# Patient Record
Sex: Female | Born: 1985 | Race: White | Hispanic: No | Marital: Single | Smoking: Never smoker
Health system: Southern US, Community
[De-identification: ages and names within clinical notes are randomized; demographics above are authoritative.]

## PROBLEM LIST (undated history)

## (undated) DIAGNOSIS — R51 Headache: Secondary | ICD-10-CM

## (undated) DIAGNOSIS — K219 Gastro-esophageal reflux disease without esophagitis: Secondary | ICD-10-CM

## (undated) DIAGNOSIS — R030 Elevated blood-pressure reading, without diagnosis of hypertension: Secondary | ICD-10-CM

## (undated) DIAGNOSIS — R519 Headache, unspecified: Secondary | ICD-10-CM

## (undated) DIAGNOSIS — I10 Essential (primary) hypertension: Secondary | ICD-10-CM

## (undated) DIAGNOSIS — J45909 Unspecified asthma, uncomplicated: Secondary | ICD-10-CM

## (undated) DIAGNOSIS — R42 Dizziness and giddiness: Secondary | ICD-10-CM

## (undated) DIAGNOSIS — C541 Malignant neoplasm of endometrium: Secondary | ICD-10-CM

## (undated) DIAGNOSIS — R1011 Right upper quadrant pain: Secondary | ICD-10-CM

## (undated) HISTORY — PX: NO PAST SURGERIES: SHX2092

## (undated) HISTORY — DX: Gastro-esophageal reflux disease without esophagitis: K21.9

## (undated) HISTORY — DX: Unspecified asthma, uncomplicated: J45.909

## (undated) HISTORY — DX: Essential (primary) hypertension: I10

## (undated) HISTORY — DX: Dizziness and giddiness: R42

## (undated) HISTORY — DX: Malignant neoplasm of endometrium: C54.1

## (undated) HISTORY — DX: Right upper quadrant pain: R10.11

## (undated) HISTORY — PX: ABDOMINAL HYSTERECTOMY: SHX81

## (undated) HISTORY — DX: Headache: R51

## (undated) HISTORY — DX: Headache, unspecified: R51.9

## (undated) HISTORY — DX: Elevated blood-pressure reading, without diagnosis of hypertension: R03.0

---

## 2016-08-30 ENCOUNTER — Ambulatory Visit
Admission: RE | Admit: 2016-08-30 | Discharge: 2016-08-30 | Disposition: A | Discharge status: Home / Self Care | Payer: 59 | Source: Ambulatory Visit | Attending: Family | Admitting: Family

## 2016-08-30 ENCOUNTER — Ambulatory Visit (INDEPENDENT_AMBULATORY_CARE_PROVIDER_SITE_OTHER): Payer: 59 | Admitting: Family

## 2016-08-30 ENCOUNTER — Encounter: Payer: Self-pay | Admitting: Family

## 2016-08-30 ENCOUNTER — Telehealth: Payer: Self-pay | Admitting: Family

## 2016-08-30 VITALS — BP 166/90 | HR 99 | Temp 98.4°F | Ht 67.5 in | Wt 310.2 lb

## 2016-08-30 DIAGNOSIS — R1011 Right upper quadrant pain: Secondary | ICD-10-CM | POA: Insufficient documentation

## 2016-08-30 DIAGNOSIS — Z7689 Persons encountering health services in other specified circumstances: Secondary | ICD-10-CM | POA: Diagnosis not present

## 2016-08-30 DIAGNOSIS — R030 Elevated blood-pressure reading, without diagnosis of hypertension: Secondary | ICD-10-CM

## 2016-08-30 DIAGNOSIS — I10 Essential (primary) hypertension: Secondary | ICD-10-CM | POA: Insufficient documentation

## 2016-08-30 DIAGNOSIS — Z0000 Encounter for general adult medical examination without abnormal findings: Secondary | ICD-10-CM | POA: Insufficient documentation

## 2016-08-30 HISTORY — DX: Right upper quadrant pain: R10.11

## 2016-08-30 HISTORY — DX: Elevated blood-pressure reading, without diagnosis of hypertension: R03.0

## 2016-08-30 LAB — POCT URINE PREGNANCY: PREG TEST UR: NEGATIVE

## 2016-08-30 MED ORDER — LISINOPRIL 5 MG PO TABS: 5.0000 mg | ORAL_TABLET | Freq: Every day | ORAL | 1 refills | Status: DC

## 2016-08-30 NOTE — Patient Instructions (Signed)
Trial of lisinopril for blood pressure. Goal blood pressures less than 140/90. Please monitor and let me know if headaches do not resolve.   Right upper quadrant ultrasound ordered for today.  Labs today. Follow-up in one week to recheck blood pressure and lab work.   Follow-up in one month for physical.

## 2016-08-30 NOTE — Telephone Encounter (Signed)
Discussed results abdominal ultrasound which not show any acute cholelithiasis. we jointly agreed she would try Prilosec for the next couple days and follow up next week to decide if we did a hepatobiliary NM scan.

## 2016-08-30 NOTE — Progress Notes (Signed)
Subjective:    Patient ID: Laurie Cameron, female    DOB: 11/19/1985, 30 y.o.   MRN: [id number]  CC: Laurie OkSarah Cameron is a 30 y.o. female who presents today to establish care.    HPI: No prior PCP. New to practice.  Complains of RUQ pain  which started 5 days ago, would 'bounce around' from left side side of chest to right side. Triggered my meals. Last 1/2 hour and then resolves on it's own.  Denies exertional chest pain or pressure, numbness or tingling radiating to left arm or jaw, palpitations, dizziness, frequent headaches, changes in vision, or shortness of breath. Hasn't tried any medications.   Also concerned about blood pressure has been going on for several months, unchanged. At dentist couple months ago 145/97. HA"s Almost everyday. Pain not in one place, sometimes back of neck, others front of head. Ibuprofen works. Mostly notices HA at work. Not waking her up, positional.   No arrhythmia, SCD in family    HISTORY:  Past Medical History:  Diagnosis Date  . Asthma   . Dizziness   . Frequent headaches   . GERD (gastroesophageal reflux disease)   . Hypertension    History reviewed. No pertinent surgical history. Family History  Problem Relation Age of Onset  . Arthritis Mother   . Hyperlipidemia Mother   . Hypertension Mother   . Arthritis Father   . Hyperlipidemia Father   . Heart disease Father   . Hypertension Father   . GER disease Father   . Alcohol abuse Paternal Uncle   . Cancer Paternal Uncle   . Arthritis Maternal Grandmother   . Cancer Maternal Grandmother   . Hyperlipidemia Maternal Grandmother   . Heart disease Maternal Grandmother   . Hypertension Maternal Grandmother   . Diabetes Maternal Grandmother   . Arthritis Maternal Grandfather   . Hyperlipidemia Maternal Grandfather   . Alcohol abuse Paternal Grandmother   . Arthritis Paternal Grandmother   . Hyperlipidemia Paternal Grandmother   . Hypertension Paternal Grandmother   . Mental illness  Paternal Grandmother   . Alcohol abuse Paternal Grandfather   . Arthritis Paternal Grandfather   . Hyperlipidemia Paternal Grandfather   . Cancer Maternal Aunt   . Hyperlipidemia Maternal Aunt   . Hypertension Maternal Aunt   . Diabetes Maternal Aunt   . Cancer Maternal Uncle   . Mental illness Paternal Aunt     Allergies: Patient has no allergy information on record. No current outpatient prescriptions on file prior to visit.   No current facility-administered medications on file prior to visit.     Social History  Substance Use Topics  . Smoking status: Never Smoker  . Smokeless tobacco: Never Used  . Alcohol use Yes    Review of Systems  Constitutional: Negative for chills, fever and unexpected weight change.  HENT: Negative for congestion.   Respiratory: Negative for cough.   Cardiovascular: Negative for chest pain, palpitations and leg swelling.  Gastrointestinal: Positive for abdominal pain. Negative for abdominal distention, diarrhea, nausea and vomiting.  Musculoskeletal: Negative for arthralgias and myalgias.  Skin: Negative for rash.  Neurological: Positive for headaches.  Hematological: Negative for adenopathy.  Psychiatric/Behavioral: Negative for confusion.      Objective:    BP (!) 166/90   Pulse 99   Temp 98.4 F (36.9 C) (Oral)   Ht 5' 7.5" (1.715 m)   Wt (!) 310 lb 3.2 oz (140.7 kg)   LMP 08/16/2016 (Exact Date)   SpO2 99%  BMI 47.87 kg/m  BP Readings from Last 3 Encounters:  08/30/16 (!) 166/90   Wt Readings from Last 3 Encounters:  08/30/16 (!) 310 lb 3.2 oz (140.7 kg)    Physical Exam  Constitutional: She appears well-developed and well-nourished.  HENT:  Mouth/Throat: Uvula is midline, oropharynx is clear and moist and mucous membranes are normal.  Eyes: Conjunctivae and EOM are normal. Pupils are equal, round, and reactive to light.  Fundus normal bilaterally.   Cardiovascular: Normal rate, regular rhythm, normal heart sounds and  normal pulses.   Pulmonary/Chest: Effort normal and breath sounds normal. She has no wheezes. She has no rhonchi. She has no rales.  Abdominal: Soft. Normal appearance and bowel sounds are normal. She exhibits no distension, no fluid wave, no ascites and no mass. There is no tenderness. There is no rigidity, no rebound, no guarding and no CVA tenderness.  Neurological: She is alert. She has normal strength. No cranial nerve deficit or sensory deficit. She displays a negative Romberg sign.  Reflex Scores:      Bicep reflexes are 2+ on the right side and 2+ on the left side.      Patellar reflexes are 2+ on the right side and 2+ on the left side. Grip equal and strong bilateral upper extremities. Gait strong and steady. Able to perform rapid alternating movement without difficulty.   Skin: Skin is warm and dry.  Psychiatric: She has a normal mood and affect. Her speech is normal and behavior is normal. Thought content normal.  Vitals reviewed.      Assessment & Plan:   Problem List Items Addressed This Visit      Other   Elevated blood pressure reading    Reassured by normal neurologic exam. In the context of patient's headache, family history, and elevated blood pressure reading today, we jointly agreed to go ahead and start blood pressure medication. Follow-up in one week.      Relevant Medications   lisinopril (PRINIVIL,ZESTRIL) 5 MG tablet   RUQ pain    Symptoms consistent with possible cholelithiasis. Pending ultrasound, labs.      Relevant Orders   US Abdomen Limited RUQ   Encounter to establish care - Primary    Reviewed past medical social family history the patient. Patient will return for CPE. Went ahead and ordered screening CPE labs today. Note Has never had pap.      Relevant Orders   US Abdomen Limited RUQ   CBC with Differential/Platelet   Comprehensive metabolic panel   Hemoglobin A1c   HIV antibody   Lipid panel   TSH   VITAMIN D 25 Hydroxy (Vit-D Deficiency,  Fractures)   POCT urine pregnancy       I am having Ms. Kinner start on lisinopril.   Meds ordered this encounter  Medications  . lisinopril (PRINIVIL,ZESTRIL) 5 MG tablet    Sig: Take 1 tablet (5 mg total) by mouth daily.    Dispense:  90 tablet    Refill:  1    Order Specific Question:   Supervising Provider    Answer:   Sherlene ShamsULLO, TERESA L [[id number]]    Return precautions given.   Risks, benefits, and alternatives of the medications and treatment plan prescribed today were discussed, and patient expressed understanding.   Education regarding symptom management and diagnosis given to patient on AVS.  Continue to follow with Rennie PlowmanMargaret Zabdi Mis, FNP for routine health maintenance.   Laurie OkSarah Ramaswamy and I agreed with plan.   Claris CheMargaret  Vidal Schwalbe, FNP

## 2016-08-30 NOTE — Assessment & Plan Note (Signed)
Reassured by normal neurologic exam. In the context of patient's headache, family history, and elevated blood pressure reading today, we jointly agreed to go ahead and start blood pressure medication. Follow-up in one week.

## 2016-08-30 NOTE — Assessment & Plan Note (Signed)
Reviewed past medical social family history the patient. Patient will return for CPE. Went ahead and ordered screening CPE labs today. Note Has never had pap.

## 2016-08-30 NOTE — Assessment & Plan Note (Signed)
Symptoms consistent with possible cholelithiasis. Pending ultrasound, labs.

## 2016-08-30 NOTE — Progress Notes (Signed)
Pre visit review using our clinic review tool, if applicable. No additional management support is needed unless otherwise documented below in the visit note. 

## 2016-09-02 LAB — CBC WITH DIFFERENTIAL/PLATELET
BASOS ABS: 0 10*3/uL (ref 0.0–0.2)
BASOS: 0 %
EOS (ABSOLUTE): 0.2 10*3/uL (ref 0.0–0.4)
Eos: 2 %
Hematocrit: 39.6 % (ref 34.0–46.6)
Hemoglobin: 13.4 g/dL (ref 11.1–15.9)
IMMATURE GRANS (ABS): 0 10*3/uL (ref 0.0–0.1)
IMMATURE GRANULOCYTES: 0 %
LYMPHS: 27 %
Lymphocytes Absolute: 2.5 10*3/uL (ref 0.7–3.1)
MCH: 28.2 pg (ref 26.6–33.0)
MCHC: 33.8 g/dL (ref 31.5–35.7)
MCV: 83 fL (ref 79–97)
MONOS ABS: 0.4 10*3/uL (ref 0.1–0.9)
Monocytes: 5 %
NEUTROS ABS: 6.1 10*3/uL (ref 1.4–7.0)
NEUTROS PCT: 66 %
PLATELETS: 352 10*3/uL (ref 150–379)
RBC: 4.75 x10E6/uL (ref 3.77–5.28)
RDW: 13.6 % (ref 12.3–15.4)
WBC: 9.3 10*3/uL (ref 3.4–10.8)

## 2016-09-02 LAB — COMPREHENSIVE METABOLIC PANEL
ALBUMIN: 4.5 g/dL (ref 3.5–5.5)
ALK PHOS: 50 IU/L (ref 39–117)
ALT: 18 IU/L (ref 0–32)
AST: 24 IU/L (ref 0–40)
Albumin/Globulin Ratio: 1.7 (ref 1.2–2.2)
BILIRUBIN TOTAL: 0.6 mg/dL (ref 0.0–1.2)
BUN / CREAT RATIO: 18 (ref 9–23)
BUN: 13 mg/dL (ref 6–20)
CHLORIDE: 100 mmol/L (ref 96–106)
CO2: 22 mmol/L (ref 18–29)
Calcium: 9.3 mg/dL (ref 8.7–10.2)
Creatinine, Ser: 0.73 mg/dL (ref 0.57–1.00)
GFR calc Af Amer: 127 mL/min/{1.73_m2} (ref >59)
GFR calc non Af Amer: 110 mL/min/{1.73_m2} (ref >59)
GLOBULIN, TOTAL: 2.7 g/dL (ref 1.5–4.5)
Glucose: 101 mg/dL — ABNORMAL HIGH (ref 65–99)
POTASSIUM: 5.2 mmol/L (ref 3.5–5.2)
SODIUM: 139 mmol/L (ref 134–144)
Total Protein: 7.2 g/dL (ref 6.0–8.5)

## 2016-09-02 LAB — LIPID PANEL
CHOLESTEROL TOTAL: 211 mg/dL — AB (ref 100–199)
Chol/HDL Ratio: 4.1 ratio units (ref 0.0–4.4)
HDL: 51 mg/dL (ref >39)
LDL Calculated: 136 mg/dL — ABNORMAL HIGH (ref 0–99)
Triglycerides: 118 mg/dL (ref 0–149)
VLDL Cholesterol Cal: 24 mg/dL (ref 5–40)

## 2016-09-02 LAB — TSH: TSH: 4.18 u[IU]/mL (ref 0.450–4.500)

## 2016-09-02 LAB — VITAMIN D 25 HYDROXY (VIT D DEFICIENCY, FRACTURES): Vit D, 25-Hydroxy: 24.6 ng/mL — ABNORMAL LOW (ref 30.0–100.0)

## 2016-09-02 LAB — HIV ANTIBODY (ROUTINE TESTING W REFLEX): HIV SCREEN 4TH GENERATION: NONREACTIVE

## 2016-09-02 LAB — HEMOGLOBIN A1C
Est. average glucose Bld gHb Est-mCnc: 117 mg/dL
HEMOGLOBIN A1C: 5.7 % — AB (ref 4.8–5.6)

## 2016-09-06 ENCOUNTER — Ambulatory Visit (INDEPENDENT_AMBULATORY_CARE_PROVIDER_SITE_OTHER): Payer: 59 | Admitting: Family

## 2016-09-06 ENCOUNTER — Encounter: Payer: Self-pay | Admitting: Family

## 2016-09-06 VITALS — BP 140/78 | HR 86 | Wt 308.0 lb

## 2016-09-06 DIAGNOSIS — R030 Elevated blood-pressure reading, without diagnosis of hypertension: Secondary | ICD-10-CM

## 2016-09-06 NOTE — Progress Notes (Signed)
Subjective:    Patient ID: Laurie Cameron, female    DOB: March 19, 1986, 30 y.o.   MRN: [id number]  CC: Laurie Cameron is a 30 y.o. female who presents today for follow up.   HPI: One-week follow-up after starting lisinopril for elevated blood pressure reading. HA's resolved. Denies exertional chest pain or pressure, numbness or tingling radiating to left arm or jaw, palpitations, dizziness, frequent headaches, changes in vision, or shortness of breath.   No RUQ pain.     HISTORY:  Past Medical History:  Diagnosis Date  . Asthma   . Dizziness   . Frequent headaches   . GERD (gastroesophageal reflux disease)   . Hypertension    No past surgical history on file. Family History  Problem Relation Age of Onset  . Arthritis Mother   . Hyperlipidemia Mother   . Hypertension Mother   . Arthritis Father   . Hyperlipidemia Father   . Heart disease Father   . Hypertension Father   . GER disease Father   . Alcohol abuse Paternal Uncle   . Cancer Paternal Uncle   . Arthritis Maternal Grandmother   . Cancer Maternal Grandmother   . Hyperlipidemia Maternal Grandmother   . Heart disease Maternal Grandmother   . Hypertension Maternal Grandmother   . Diabetes Maternal Grandmother   . Arthritis Maternal Grandfather   . Hyperlipidemia Maternal Grandfather   . Alcohol abuse Paternal Grandmother   . Arthritis Paternal Grandmother   . Hyperlipidemia Paternal Grandmother   . Hypertension Paternal Grandmother   . Mental illness Paternal Grandmother   . Alcohol abuse Paternal Grandfather   . Arthritis Paternal Grandfather   . Hyperlipidemia Paternal Grandfather   . Cancer Maternal Aunt   . Hyperlipidemia Maternal Aunt   . Hypertension Maternal Aunt   . Diabetes Maternal Aunt   . Cancer Maternal Uncle   . Mental illness Paternal Aunt     Allergies: Patient has no allergy information on record. Current Outpatient Prescriptions on File Prior to Visit  Medication Sig Dispense Refill  .  lisinopril (PRINIVIL,ZESTRIL) 5 MG tablet Take 1 tablet (5 mg total) by mouth daily. 90 tablet 1   No current facility-administered medications on file prior to visit.     Social History  Substance Use Topics  . Smoking status: Never Smoker  . Smokeless tobacco: Never Used  . Alcohol use Yes    Review of Systems  Constitutional: Negative for chills and fever.  Eyes: Negative for visual disturbance.  Respiratory: Negative for cough.   Cardiovascular: Negative for chest pain and palpitations.  Gastrointestinal: Negative for nausea and vomiting.  Neurological: Negative for headaches.      Objective:    BP 140/78   Pulse 86   Wt (!) 308 lb (139.7 kg)   LMP 08/16/2016 (Exact Date)   SpO2 98%   BMI 47.53 kg/m  BP Readings from Last 3 Encounters:  09/06/16 140/78  08/30/16 (!) 166/90   Wt Readings from Last 3 Encounters:  09/06/16 (!) 308 lb (139.7 kg)  08/30/16 (!) 310 lb 3.2 oz (140.7 kg)    Physical Exam  Constitutional: She appears well-developed and well-nourished.  Eyes: Conjunctivae are normal.  Cardiovascular: Normal rate, regular rhythm, normal heart sounds and normal pulses.   Pulmonary/Chest: Effort normal and breath sounds normal. She has no wheezes. She has no rhonchi. She has no rales.  Neurological: She is alert.  Skin: Skin is warm and dry.  Psychiatric: She has a normal mood and affect.  Her speech is normal and behavior is normal. Thought content normal.  Vitals reviewed.      Assessment & Plan:   Problem List Items Addressed This Visit      Other   Elevated blood pressure reading - Primary    At goal. Pending CMP.       Relevant Orders   Comprehensive metabolic panel       I am having Ms. Grimmer maintain her lisinopril.   No orders of the defined types were placed in this encounter.   Return precautions given.   Risks, benefits, and alternatives of the medications and treatment plan prescribed today were discussed, and patient  expressed understanding.   Education regarding symptom management and diagnosis given to patient on AVS.  Continue to follow with Rennie PlowmanMargaret Dailey Alberson, FNP for routine health maintenance.   Laurie Cameron and I agreed with plan.   Rennie PlowmanMargaret Harden Bramer, FNP

## 2016-09-06 NOTE — Patient Instructions (Signed)
Check labs today  Let me know if abdominal pain recurs and I will order hepatobiliary scan  Blood pressure looks great.

## 2016-09-06 NOTE — Assessment & Plan Note (Signed)
At goal. Pending CMP. 

## 2016-09-07 LAB — COMPREHENSIVE METABOLIC PANEL
ALT: 19 IU/L (ref 0–32)
AST: 22 IU/L (ref 0–40)
Albumin/Globulin Ratio: 1.6 (ref 1.2–2.2)
Albumin: 4.7 g/dL (ref 3.5–5.5)
Alkaline Phosphatase: 47 IU/L (ref 39–117)
BILIRUBIN TOTAL: 0.4 mg/dL (ref 0.0–1.2)
BUN/Creatinine Ratio: 15 (ref 9–23)
BUN: 13 mg/dL (ref 6–20)
CALCIUM: 9.8 mg/dL (ref 8.7–10.2)
CHLORIDE: 98 mmol/L (ref 96–106)
CO2: 21 mmol/L (ref 18–29)
Creatinine, Ser: 0.84 mg/dL (ref 0.57–1.00)
GFR calc non Af Amer: 93 mL/min/{1.73_m2} (ref >59)
GFR, EST AFRICAN AMERICAN: 107 mL/min/{1.73_m2} (ref >59)
GLUCOSE: 103 mg/dL — AB (ref 65–99)
Globulin, Total: 2.9 g/dL (ref 1.5–4.5)
POTASSIUM: 4.6 mmol/L (ref 3.5–5.2)
Sodium: 141 mmol/L (ref 134–144)
TOTAL PROTEIN: 7.6 g/dL (ref 6.0–8.5)

## 2016-10-06 ENCOUNTER — Other Ambulatory Visit: Payer: Self-pay

## 2016-10-06 DIAGNOSIS — R030 Elevated blood-pressure reading, without diagnosis of hypertension: Secondary | ICD-10-CM

## 2016-10-06 MED ORDER — LISINOPRIL 5 MG PO TABS: 5.0000 mg | ORAL_TABLET | Freq: Every day | ORAL | 1 refills | Status: DC

## 2016-10-06 NOTE — Telephone Encounter (Signed)
Medication has been refilled.

## 2016-10-12 ENCOUNTER — Encounter: Payer: Self-pay | Admitting: Family

## 2016-11-07 ENCOUNTER — Ambulatory Visit (INDEPENDENT_AMBULATORY_CARE_PROVIDER_SITE_OTHER): Payer: 59 | Admitting: Family

## 2016-11-07 ENCOUNTER — Encounter: Payer: Self-pay | Admitting: Family

## 2016-11-07 VITALS — BP 132/82 | HR 88 | Temp 98.2°F | Ht 67.5 in | Wt 311.8 lb

## 2016-11-07 DIAGNOSIS — R1011 Right upper quadrant pain: Secondary | ICD-10-CM

## 2016-11-07 NOTE — Assessment & Plan Note (Addendum)
Continues to have right upper quadrant pain, triggered by fat meals. Classically supports working diagnosis of cholelithiasis. Labs again today to ensure no acute cholelithiasis. Patient and I discussed appropriate to move forward with hepatobiliary scan. Also discussed with patient alternative diagnoses including gastroparesis from prediabetes, although unlikely as patient is prediabetic range. Also discussed with patient ischemic bowel. However patient is not having sudden, acute pain with n, v currently; we jointly agreed that if hepatobiliary scan was unrevealing, we would pursue CT without contrast of abdomen.

## 2016-11-07 NOTE — Patient Instructions (Addendum)
Labs today  NM scan- will call you to schedule  Let me know if pain acutely worsens or new symptoms developw

## 2016-11-07 NOTE — Progress Notes (Signed)
Subjective:    Patient ID: Laurie Cameron, female    DOB: 1986/07/13, 31 y.o.   MRN: [id number]  CC: Laurie Cameron is a 31 y.o. female who presents today for an acute visit.    HPI: CC: right side pain has continued since last visit, had seen some improvement with abstaining from high fat foods. Describes pain as 'pressure.' Had milk in coffee last week and had RUQ pain, lasted for a day and resolve. RUQ waxing and waning. Pain 'seems to start RUQ' and then radiates to right chest wall and right shoulder blade.    No rash or injury. Tried zantac without relief. No early fullness. Regular BMs. No h/o PVD.   Pain triggered by meals. Prediabetic  No fever, N, V, left sided chest pain, sob, numbness or tingling radiating to left arm or jaw, palpitations, dizziness, frequent headaches, changes in vision, or shortness of breath.     At that time RUQ US showed fatty liver, no acute cholelithiasis.  Ct wo contrast       HISTORY:  Past Medical History:  Diagnosis Date  . Asthma   . Dizziness   . Frequent headaches   . GERD (gastroesophageal reflux disease)   . Hypertension    No past surgical history on file. Family History  Problem Relation Age of Onset  . Arthritis Mother   . Hyperlipidemia Mother   . Hypertension Mother   . Arthritis Father   . Hyperlipidemia Father   . Heart disease Father   . Hypertension Father   . GER disease Father   . Alcohol abuse Paternal Uncle   . Cancer Paternal Uncle   . Arthritis Maternal Grandmother   . Cancer Maternal Grandmother   . Hyperlipidemia Maternal Grandmother   . Heart disease Maternal Grandmother   . Hypertension Maternal Grandmother   . Diabetes Maternal Grandmother   . Arthritis Maternal Grandfather   . Hyperlipidemia Maternal Grandfather   . Alcohol abuse Paternal Grandmother   . Arthritis Paternal Grandmother   . Hyperlipidemia Paternal Grandmother   . Hypertension Paternal Grandmother   . Mental illness Paternal  Grandmother   . Alcohol abuse Paternal Grandfather   . Arthritis Paternal Grandfather   . Hyperlipidemia Paternal Grandfather   . Cancer Maternal Aunt   . Hyperlipidemia Maternal Aunt   . Hypertension Maternal Aunt   . Diabetes Maternal Aunt   . Cancer Maternal Uncle   . Mental illness Paternal Aunt     Allergies: Patient has no allergy information on record. Current Outpatient Prescriptions on File Prior to Visit  Medication Sig Dispense Refill  . lisinopril (PRINIVIL,ZESTRIL) 5 MG tablet Take 1 tablet (5 mg total) by mouth daily. 90 tablet 1   No current facility-administered medications on file prior to visit.     Social History  Substance Use Topics  . Smoking status: Never Smoker  . Smokeless tobacco: Never Used  . Alcohol use Yes    Review of Systems  Constitutional: Negative for chills and fever.  Respiratory: Negative for cough.   Cardiovascular: Negative for chest pain and palpitations.  Gastrointestinal: Positive for abdominal pain. Negative for abdominal distention, constipation, nausea and vomiting.      Objective:    BP 132/82   Pulse 88   Temp 98.2 F (36.8 C) (Oral)   Ht 5' 7.5" (1.715 m)   Wt (!) 311 lb 12.8 oz (141.4 kg)   LMP 10/13/2016   SpO2 99%   BMI 48.11 kg/m  Physical Exam  Constitutional: She appears well-developed and well-nourished.  Eyes: Conjunctivae are normal.  Cardiovascular: Normal rate, regular rhythm, normal heart sounds and normal pulses.   Pulmonary/Chest: Effort normal and breath sounds normal. She has no wheezes. She has no rhonchi. She has no rales.  Abdominal: Soft. Normal appearance and bowel sounds are normal. She exhibits no distension, no fluid wave, no ascites and no mass. There is tenderness. There is positive Murphy's sign. There is no rigidity, no rebound, no guarding, no CVA tenderness and no tenderness at McBurney's point.  Neurological: She is alert.  Skin: Skin is warm and dry.  Psychiatric: She has a  normal mood and affect. Her speech is normal and behavior is normal. Thought content normal.  Vitals reviewed.      Assessment & Plan:   Problem List Items Addressed This Visit      Other   RUQ pain - Primary    Continues to have right upper quadrant pain, triggered by fat meals. Classically supports working diagnosis of cholelithiasis. Labs again today to ensure no acute cholelithiasis. Patient and I discussed appropriate to move forward with hepatobiliary scan. Also discussed with patient alternative diagnoses including gastroparesis from prediabetes, although unlikely as patient is prediabetic range. Also discussed with patient ischemic bowel. However patient is not having sudden, acute pain with n, v currently; we jointly agreed that if hepatobiliary scan was unrevealing, we would pursue CT without contrast of abdomen.      Relevant Orders   NM HEPATOBILIARY INCLUDING GB   Comprehensive metabolic panel   CBC with Differential/Platelet   Lipase        I am having Laurie Cameron maintain her lisinopril.   No orders of the defined types were placed in this encounter.   Return precautions given.   Risks, benefits, and alternatives of the medications and treatment plan prescribed today were discussed, and patient expressed understanding.   Education regarding symptom management and diagnosis given to patient on AVS.  Continue to follow with [name], FNP for routine health maintenance.   Laurie Ok and I agreed with plan.   [name], FNP

## 2016-11-07 NOTE — Addendum Note (Signed)
Addended by: Penne LashWIGGINS, Kamariya Blevens N on: 11/07/2016 02:19 PM   Modules accepted: Orders

## 2016-11-07 NOTE — Progress Notes (Signed)
Pre visit review using our clinic review tool, if applicable. No additional management support is needed unless otherwise documented below in the visit note. 

## 2016-11-08 ENCOUNTER — Telehealth: Payer: Self-pay | Admitting: *Deleted

## 2016-11-08 NOTE — Telephone Encounter (Signed)
Pt requested have her Labs at [facility], however they can not view needed labs Pt contact [phone]

## 2016-11-08 NOTE — Telephone Encounter (Signed)
Patient coming by to pick up lab orders to have drawn at [facility] since they were unable to view by computer.

## 2016-11-09 NOTE — Telephone Encounter (Signed)
Has this been done?

## 2016-11-09 NOTE — Telephone Encounter (Signed)
Scripts have been picked up.

## 2016-11-10 ENCOUNTER — Other Ambulatory Visit: Payer: Self-pay | Admitting: Family

## 2016-11-11 LAB — COMPREHENSIVE METABOLIC PANEL
A/G RATIO: 1.7 (ref 1.2–2.2)
ALBUMIN: 4.7 g/dL (ref 3.5–5.5)
ALT: 21 IU/L (ref 0–32)
AST: 29 IU/L (ref 0–40)
Alkaline Phosphatase: 46 IU/L (ref 39–117)
BUN/Creatinine Ratio: 16 (ref 9–23)
BUN: 11 mg/dL (ref 6–20)
Bilirubin Total: 0.5 mg/dL (ref 0.0–1.2)
CALCIUM: 10.5 mg/dL — AB (ref 8.7–10.2)
CO2: 28 mmol/L (ref 18–29)
CREATININE: 0.67 mg/dL (ref 0.57–1.00)
Chloride: 99 mmol/L (ref 96–106)
GFR, EST AFRICAN AMERICAN: 135 mL/min/{1.73_m2} (ref >59)
GFR, EST NON AFRICAN AMERICAN: 118 mL/min/{1.73_m2} (ref >59)
GLOBULIN, TOTAL: 2.7 g/dL (ref 1.5–4.5)
Glucose: 92 mg/dL (ref 65–99)
POTASSIUM: 5.3 mmol/L — AB (ref 3.5–5.2)
SODIUM: 135 mmol/L (ref 134–144)
Total Protein: 7.4 g/dL (ref 6.0–8.5)

## 2016-11-11 LAB — LIPASE: Lipase: 37 U/L (ref 14–72)

## 2016-11-11 LAB — CBC WITH DIFFERENTIAL/PLATELET
BASOS ABS: 0 10*3/uL (ref 0.0–0.2)
Basos: 0 %
EOS (ABSOLUTE): 0.2 10*3/uL (ref 0.0–0.4)
Eos: 2 %
HEMATOCRIT: 39.7 % (ref 34.0–46.6)
HEMOGLOBIN: 13.6 g/dL (ref 11.1–15.9)
Lymphocytes Absolute: 3.6 10*3/uL — ABNORMAL HIGH (ref 0.7–3.1)
Lymphs: 34 %
MCH: 29.2 pg (ref 26.6–33.0)
MCHC: 34.3 g/dL (ref 31.5–35.7)
MCV: 85 fL (ref 79–97)
MONOCYTES: 8 %
Monocytes Absolute: 0.8 10*3/uL (ref 0.1–0.9)
NEUTROS ABS: 6.1 10*3/uL (ref 1.4–7.0)
NEUTROS PCT: 56 %
Platelets: 332 10*3/uL (ref 150–379)
RBC: 4.66 x10E6/uL (ref 3.77–5.28)
RDW: 13.4 % (ref 12.3–15.4)
WBC: 10.8 10*3/uL (ref 3.4–10.8)

## 2016-11-15 ENCOUNTER — Other Ambulatory Visit: Payer: Self-pay | Admitting: Family

## 2016-11-15 DIAGNOSIS — R1011 Right upper quadrant pain: Secondary | ICD-10-CM

## 2016-11-15 NOTE — Progress Notes (Unsigned)
Ordered with EF

## 2016-11-22 ENCOUNTER — Other Ambulatory Visit: Payer: Self-pay | Admitting: Family

## 2016-11-22 DIAGNOSIS — R030 Elevated blood-pressure reading, without diagnosis of hypertension: Secondary | ICD-10-CM

## 2016-11-22 MED ORDER — AMLODIPINE BESYLATE 2.5 MG PO TABS: 2.5000 mg | ORAL_TABLET | Freq: Every day | ORAL | 3 refills | Status: DC

## 2016-11-23 ENCOUNTER — Telehealth: Payer: Self-pay | Admitting: *Deleted

## 2016-11-23 ENCOUNTER — Ambulatory Visit
Admission: RE | Admit: 2016-11-23 | Discharge: 2016-11-23 | Disposition: A | Discharge status: Home / Self Care | Payer: 59 | Source: Ambulatory Visit | Attending: Family | Admitting: Family

## 2016-11-23 ENCOUNTER — Other Ambulatory Visit: Payer: Self-pay | Admitting: Family

## 2016-11-23 DIAGNOSIS — R1011 Right upper quadrant pain: Secondary | ICD-10-CM | POA: Diagnosis not present

## 2016-11-23 DIAGNOSIS — R935 Abnormal findings on diagnostic imaging of other abdominal regions, including retroperitoneum: Secondary | ICD-10-CM | POA: Insufficient documentation

## 2016-11-23 MED ORDER — TECHNETIUM TC 99M MEBROFENIN IV KIT
5.0000 | PACK | Freq: Once | INTRAVENOUS | Status: AC | PRN
  Administered 2016-11-23: 5.2 via INTRAVENOUS

## 2016-11-23 NOTE — Progress Notes (Signed)
Ref

## 2016-11-23 NOTE — Telephone Encounter (Signed)
Pt has requested a return call  Pt contact [phone]

## 2016-11-24 NOTE — Telephone Encounter (Signed)
Please see result note 

## 2016-12-26 ENCOUNTER — Ambulatory Visit (INDEPENDENT_AMBULATORY_CARE_PROVIDER_SITE_OTHER): Payer: 59 | Admitting: Gastroenterology

## 2016-12-26 ENCOUNTER — Other Ambulatory Visit: Payer: Self-pay

## 2016-12-26 ENCOUNTER — Encounter: Payer: Self-pay | Admitting: Gastroenterology

## 2016-12-26 VITALS — BP 147/83 | HR 76 | Temp 98.6°F | Ht 67.5 in | Wt 306.6 lb

## 2016-12-26 DIAGNOSIS — R948 Abnormal results of function studies of other organs and systems: Secondary | ICD-10-CM

## 2016-12-26 NOTE — Progress Notes (Signed)
Gastroenterology Consultation  Referring Provider:     Allegra Grana, FNP Primary Care Physician:  [name], FNP Primary Gastroenterologist:  Dr. Wyline Mood  Reason for Consultation:     RUQ pain         HPI:   Laurie Cameron is a 31 y.o. y/o female referred for consultation & management  by Dr. [name], FNP.    Recent HIDA scan show+s EF of 26% RUQ USG shows fatty infiltrative changes .   Abdominal pain: Onset: began in 08/2016, since then it has been getting worse, when she eats fatty food gets the pain , 1-2 times a week , usually 30 mins after the meal, stays for a few hours  Site :RUQ pain  Radiation: goes to her bad and shoulders  Severity :5/10  Aggravating factors: eating  Relieving factors :laying down and walking  Weight loss: no  NSAID use: no  PPI use :no  Gall bladder surgery: no  Frequency of bowel movements: daily , soft and normal  Change in bowel movements: no  Relief with bowel movements: sometimes  Gas/Bloating/Abdominal distension: rarely .   Mother and all 3 siblings including her grandmother have had their gall bladders resected. Not on the oral contraceptive pills.    Past Medical History:  Diagnosis Date  . Asthma   . Dizziness   . Frequent headaches   . GERD (gastroesophageal reflux disease)   . Hypertension     No past surgical history on file.  Prior to Admission medications   Medication Sig Start Date End Date Taking? Authorizing Provider  amLODipine (NORVASC) 2.5 MG tablet Take 1 tablet (2.5 mg total) by mouth daily. 11/22/16  Yes Allegra Grana, FNP    Family History  Problem Relation Age of Onset  . Arthritis Mother   . Hyperlipidemia Mother   . Hypertension Mother   . Arthritis Father   . Hyperlipidemia Father   . Heart disease Father   . Hypertension Father   . GER disease Father   . Alcohol abuse Paternal Uncle   . Cancer Paternal Uncle   . Arthritis Maternal Grandmother   . Cancer Maternal  Grandmother   . Hyperlipidemia Maternal Grandmother   . Heart disease Maternal Grandmother   . Hypertension Maternal Grandmother   . Diabetes Maternal Grandmother   . Arthritis Maternal Grandfather   . Hyperlipidemia Maternal Grandfather   . Alcohol abuse Paternal Grandmother   . Arthritis Paternal Grandmother   . Hyperlipidemia Paternal Grandmother   . Hypertension Paternal Grandmother   . Mental illness Paternal Grandmother   . Alcohol abuse Paternal Grandfather   . Arthritis Paternal Grandfather   . Hyperlipidemia Paternal Grandfather   . Cancer Maternal Aunt   . Hyperlipidemia Maternal Aunt   . Hypertension Maternal Aunt   . Diabetes Maternal Aunt   . Cancer Maternal Uncle   . Mental illness Paternal Aunt      Social History  Substance Use Topics  . Smoking status: Never Smoker  . Smokeless tobacco: Never Used  . Alcohol use Yes    Allergies as of 12/26/2016  . (Not on File)    Review of Systems:    All systems reviewed and negative except where noted in HPI.   Physical Exam:  BP (!) 147/83 (BP Location: Right Arm, Patient Position: Sitting, Cuff Size: Large)   Pulse 76   Temp 98.6 F (37 C) (Oral)   Ht 5' 7.5" (1.715 m)   Wt (!) 306  lb 9.6 oz (139.1 kg)   BMI 47.31 kg/m  No LMP recorded. Psych:  Alert and cooperative. Normal mood and affect. General:   Alert,  Well-developed, well-nourished, pleasant and cooperative in NAD Head:  Normocephalic and atraumatic. Eyes:  Sclera clear, no icterus.   Conjunctiva pink. Ears:  Normal auditory acuity. Nose:  No deformity, discharge, or lesions. Mouth:  No deformity or lesions,oropharynx pink & moist. Neck:  Supple; no masses or thyromegaly. Lungs:  Respirations even and unlabored.  Clear throughout to auscultation.   No wheezes, crackles, or rhonchi. No acute distress. Heart:  Regular rate and rhythm; no murmurs, clicks, rubs, or gallops. Abdomen:  Normal bowel sounds.  No bruits.  Soft, non-tender and  non-distended without masses, hepatosplenomegaly or hernias noted.  No guarding or rebound tenderness.   . Pulses:  Normal pulses noted. Extremities:  No clubbing or edema.  No cyanosis. Neurologic:  Alert and oriented x3;  grossly normal neurologically. Psych:  Alert and cooperative. Normal mood and affect.  Imaging Studies: No results found.  Assessment and Plan:   Laurie Cameron is a 31 y.o. y/o female has been referred for RUQ pain . Her recent HIDA scan shows a low EF. Strong family history of gall bladder issues. Suggested she may benefit from a cholecystectomy   Plan  1. Refer to Dr Everlene Farrier for cholecystectomy  2. Suggest weight loss for fatty liver   Follow up as needed .   Dr Wyline Mood MD

## 2017-01-04 ENCOUNTER — Encounter: Payer: Self-pay | Admitting: General Surgery

## 2017-01-04 ENCOUNTER — Ambulatory Visit (INDEPENDENT_AMBULATORY_CARE_PROVIDER_SITE_OTHER): Payer: 59 | Admitting: General Surgery

## 2017-01-04 VITALS — BP 138/90 | HR 87 | Temp 98.2°F | Ht 67.0 in | Wt 307.2 lb

## 2017-01-04 DIAGNOSIS — K828 Other specified diseases of gallbladder: Secondary | ICD-10-CM

## 2017-01-04 DIAGNOSIS — Z01818 Encounter for other preprocedural examination: Secondary | ICD-10-CM

## 2017-01-04 NOTE — Progress Notes (Signed)
Patient ID: Laurie Cameron, female   DOB: 09/25/1986, 31 y.o.   MRN: [id number]  CC: RUQ PAIN  HPI Ginnie Marich is a 31 y.o. female with a long-standing history of right upper quadrant pain. She was sent to surgery for evaluation by Dr. Tobi Bastos from GI. She's had an extensive workup of her abdominal pain. Patient reports is always in the right upper quadrant is made worse by eating a fatty meal. It is better with occasional pain medications and walking around. She had a HIDA scan as an outpatient that acutely worsened her pain but diagnosed her with biliary dyskinesia. She denies any other symptoms. She denies any fevers, chills, nausea, vomiting, chest pain, shortness of breath, diarrhea, constipation. She is otherwise in her usual state of health. She strongly desires to be done with this pain and if removing her gallbladder does this she is willing to accept it.  HPI  Past Medical History:  Diagnosis Date  . Asthma   . Dizziness   . Elevated blood pressure reading 08/30/2016  . Frequent headaches   . GERD (gastroesophageal reflux disease)   . Hypertension   . RUQ pain 08/30/2016    History reviewed. No pertinent surgical history.  Family History  Problem Relation Age of Onset  . Arthritis Mother   . Hyperlipidemia Mother   . Hypertension Mother   . Arthritis Father   . Hyperlipidemia Father   . Heart disease Father   . Hypertension Father   . GER disease Father   . Alcohol abuse Paternal Uncle   . Cancer Paternal Uncle   . Arthritis Maternal Grandmother   . Cancer Maternal Grandmother   . Hyperlipidemia Maternal Grandmother   . Heart disease Maternal Grandmother   . Hypertension Maternal Grandmother   . Diabetes Maternal Grandmother   . Arthritis Maternal Grandfather   . Hyperlipidemia Maternal Grandfather   . Alcohol abuse Paternal Grandmother   . Arthritis Paternal Grandmother   . Hyperlipidemia Paternal Grandmother   . Hypertension Paternal Grandmother   . Mental illness  Paternal Grandmother   . Alcohol abuse Paternal Grandfather   . Arthritis Paternal Grandfather   . Hyperlipidemia Paternal Grandfather   . Cancer Maternal Aunt   . Hyperlipidemia Maternal Aunt   . Hypertension Maternal Aunt   . Diabetes Maternal Aunt   . Cancer Maternal Uncle   . Mental illness Paternal Aunt     Social History Social History  Substance Use Topics  . Smoking status: Never Smoker  . Smokeless tobacco: Never Used  . Alcohol use Yes    No Known Allergies  Current Outpatient Prescriptions  Medication Sig Dispense Refill  . amLODipine (NORVASC) 2.5 MG tablet Take 1 tablet (2.5 mg total) by mouth daily. 90 tablet 3   No current facility-administered medications for this visit.      Review of Systems A Multi-point review of systems was asked and was negative except for the findings documented in the history of present illness  Physical Exam Blood pressure 138/90, pulse 87, temperature 98.2 F (36.8 C), temperature source Oral, height  (1.702 m), weight (!) 139.3 kg (307 lb 3.2 oz). CONSTITUTIONAL: No acute distress. EYES: Pupils are equal, round, and reactive to light, Sclera are non-icteric. EARS, NOSE, MOUTH AND THROAT: The oropharynx is clear. The oral mucosa is pink and moist. Hearing is intact to voice. LYMPH NODES:  Lymph nodes in the neck are normal. RESPIRATORY:  Lungs are clear. There is normal respiratory effort, with equal breath sounds  bilaterally, and without pathologic use of accessory muscles. CARDIOVASCULAR: Heart is regular without murmurs, gallops, or rubs. GI: The abdomen is very large, soft, minimally tender to deep palpation in the right upper quadrant with a negative Rovsing sign, and nondistended. There are no palpable masses. There is no hepatosplenomegaly. There are normal bowel sounds in all quadrants. GU: Rectal deferred.   MUSCULOSKELETAL: Normal muscle strength and tone. No cyanosis or edema.   SKIN: Turgor is good and there are  no pathologic skin lesions or ulcers. NEUROLOGIC: Motor and sensation is grossly normal. Cranial nerves are grossly intact. PSYCH:  Oriented to person, place and time. Affect is normal.  Data Reviewed Images and labs reviewed. Labs are all within normal limits, ultrasound the right upper quadrant was normal without any evidence of cholelithiasis or cholecystitis, HIDA scan showed biliary dyskinesia with a decreased ejection fraction of 26%. I have personally reviewed the patient's imaging, laboratory findings and medical records.    Assessment    Biliary dyskinesia    Plan    31 year old female with biliary dyskinesia. I discussed the procedure of a laparoscopic cholecystectomy in detail. I also discussed the possibility of using the robot for robotic assisted laparoscopic cholecystectomy. The patient was given Agricultural engineer.  We discussed the risks and benefits of a laparoscopic cholecystectomy and possible cholangiogram including, but not limited to bleeding, infection, injury to surrounding structures such as the intestine or liver, bile leak, retained gallstones, need to convert to an open procedure, prolonged diarrhea, blood clots such as  DVT, common bile duct injury, anesthesia risks, and possible need for additional procedures.  The likelihood of improvement in symptoms and return to the patient's normal status is good. We discussed the typical post-operative recovery course. Patient voiced understanding. She states she's willing to have her gallbladder removed but she is unsure whether not she wants standard laparoscopy or the robot yet. We will plan to schedule her for surgery on May 10 and she will call the clinic within the next 2 weeks to say whether not she wants to use the [employer] robot or not.      Time spent with the patient was 45 minutes, with more than 50% of the time spent in face-to-face education, counseling and care coordination.     Ricarda Frame, MD  FACS General Surgeon 01/04/2017, 3:30 PM

## 2017-01-04 NOTE — Patient Instructions (Signed)
We have scheduled your surgery on May 10 at [facility] with Dr.Woodham. Please see the [facility] pre-care sheet for surgery information. Please call and let us know if you prefer the Robot within the next week or so. Please call our office if you have questions or concerns.

## 2017-01-05 ENCOUNTER — Telehealth: Payer: Self-pay

## 2017-01-05 NOTE — Telephone Encounter (Addendum)
Patient has been advised of Surgery Date as well as Pre-Admission appointment date, time, and location.  Surgery Date: 02/01/17  Pre-admit Appointment: 01/25/17 from 9a-1p  Patient has been advised to call [phone] the day before surgery between 1-3pm to obtain arrival time.

## 2017-01-08 ENCOUNTER — Telehealth: Payer: Self-pay

## 2017-01-08 NOTE — Telephone Encounter (Signed)
Please print this page for your records.  Your Notification/Prior Authorization submission has been Approved and no further action is required for this request The notification/prior authorization reference number is [id number]   CPT code 09811 and ICD 10 code K82.8 is approved for surgery date 02/01/17.

## 2017-01-08 NOTE — Telephone Encounter (Signed)
Call was made to patient. She has elected to do the Laparoscopic Cholecystectomy without the use of the Robot.  All information was given to patient and she verbalizes understanding.

## 2017-01-10 ENCOUNTER — Other Ambulatory Visit: Payer: Self-pay

## 2017-01-10 ENCOUNTER — Telehealth: Payer: Self-pay

## 2017-01-10 DIAGNOSIS — R030 Elevated blood-pressure reading, without diagnosis of hypertension: Secondary | ICD-10-CM

## 2017-01-10 MED ORDER — AMLODIPINE BESYLATE 2.5 MG PO TABS: 2.5000 mg | ORAL_TABLET | Freq: Every day | ORAL | 2 refills | Status: DC

## 2017-01-10 NOTE — Telephone Encounter (Signed)
Disability Forms came in for this patient.   Her surgery is not scheduled until 02/01/17, Robotic assisted Laparoscopic Cholecystectomy with Dr. Tonita Cong

## 2017-01-10 NOTE — Telephone Encounter (Signed)
Medication has been refilled and sent to new pharmacy.

## 2017-01-18 NOTE — Telephone Encounter (Signed)
Disability Paperwork has been received and a $25.00 collection fee has been obtained.  

## 2017-01-25 ENCOUNTER — Encounter: Payer: Self-pay | Admitting: *Deleted

## 2017-01-25 ENCOUNTER — Encounter
Admission: RE | Admit: 2017-01-25 | Discharge: 2017-01-25 | Disposition: A | Discharge status: Home / Self Care | Payer: 59 | Source: Ambulatory Visit | Attending: General Surgery | Admitting: General Surgery

## 2017-01-25 ENCOUNTER — Other Ambulatory Visit: Payer: Self-pay

## 2017-01-25 DIAGNOSIS — K828 Other specified diseases of gallbladder: Secondary | ICD-10-CM

## 2017-01-25 NOTE — Patient Instructions (Signed)
  Your procedure is scheduled on: 02-01-17 (Thursday) Report to Same Day Surgery 2nd floor medical mall [facility](Medical Mall Entrance-take elevator on left to 2nd floor.  Check in with surgery information desk.) To find out your arrival time please call [phone] between 1PM - 3PM on 01-31-17 (Wednesday)  Remember: Instructions that are not followed completely may result in serious medical risk, up to and including death, or upon the discretion of your surgeon and anesthesiologist your surgery may need to be rescheduled.    _x___ 1. Do not eat food or drink liquids after midnight. No gum chewing or hard candies.     __x__ 2. No Alcohol for 24 hours before or after surgery.   __x__3. No Smoking for 24 prior to surgery.   ____  4. Bring all medications with you on the day of surgery if instructed.    __x__ 5. Notify your doctor if there is any change in your medical condition     (cold, fever, infections).     Do not wear jewelry, make-up, hairpins, clips or nail polish.  Do not wear lotions, powders, or perfumes. You may wear deodorant.  Do not shave 48 hours prior to surgery. Men may shave face and neck.  Do not bring valuables to the hospital.    [facility] is not responsible for any belongings or valuables.               Contacts, dentures or bridgework may not be worn into surgery.  Leave your suitcase in the car. After surgery it may be brought to your room.  For patients admitted to the hospital, discharge time is determined by your  treatment team.   Patients discharged the day of surgery will not be allowed to drive home.  You will need someone to drive you home and stay with you the night of your procedure.    Please read over the following fact sheets that you were given:     _x___ TAKE THE FOLLOWING MEDICATIONS THE MORNING OF SURGERY WITH A SMALL SIP OF WATER. These include:  1. AMLODIPINE (NORVASC)  2.  3.  4.  5.  6.  ____Fleets enema or Magnesium Citrate as  directed.   _x___ Use CHG Soap or sage wipes as directed on instruction sheet   ____ Use inhalers on the day of surgery and bring to hospital day of surgery  ____ Stop Metformin and Janumet 2 days prior to surgery.    ____ Take 1/2 of usual insulin dose the night before surgery and none on the morning surgery.   ____ Follow recommendations from Cardiologist, Pulmonologist or PCP regarding stopping Aspirin, Coumadin, Pllavix ,Eliquis, Effient, or Pradaxa, and Pletal.  X____Stop Anti-inflammatories such as Advil, Aleve, IBUPROFEN, Motrin, Naproxen, Naprosyn, Goodies powders or aspirin products NOW- OK to take Tylenol    ____ Stop supplements until after surgery.    ____ Bring C-Pap to the hospital.

## 2017-01-26 NOTE — Telephone Encounter (Signed)
Patient's disability forms were filled out and faxed. 

## 2017-01-30 ENCOUNTER — Ambulatory Visit
Admission: RE | Admit: 2017-01-30 | Discharge: 2017-01-30 | Disposition: A | Discharge status: Home / Self Care | Payer: 59 | Source: Ambulatory Visit | Attending: General Surgery | Admitting: General Surgery

## 2017-01-30 ENCOUNTER — Encounter
Admission: RE | Admit: 2017-01-30 | Discharge: 2017-01-30 | Disposition: A | Discharge status: Home / Self Care | Payer: 59 | Source: Ambulatory Visit | Attending: General Surgery | Admitting: General Surgery

## 2017-01-30 DIAGNOSIS — Z01818 Encounter for other preprocedural examination: Secondary | ICD-10-CM | POA: Diagnosis not present

## 2017-01-30 DIAGNOSIS — R1011 Right upper quadrant pain: Secondary | ICD-10-CM | POA: Insufficient documentation

## 2017-01-30 DIAGNOSIS — Z01812 Encounter for preprocedural laboratory examination: Secondary | ICD-10-CM | POA: Diagnosis not present

## 2017-01-30 DIAGNOSIS — Z01810 Encounter for preprocedural cardiovascular examination: Secondary | ICD-10-CM

## 2017-01-30 DIAGNOSIS — I10 Essential (primary) hypertension: Secondary | ICD-10-CM | POA: Insufficient documentation

## 2017-01-30 LAB — POTASSIUM: Potassium: 4.1 mmol/L (ref 3.5–5.1)

## 2017-02-01 ENCOUNTER — Ambulatory Visit: Payer: 59 | Admitting: Anesthesiology

## 2017-02-01 ENCOUNTER — Ambulatory Visit
Admission: RE | Admit: 2017-02-01 | Discharge: 2017-02-01 | Disposition: A | Discharge status: Home / Self Care | Payer: 59 | Source: Ambulatory Visit | Attending: General Surgery | Admitting: General Surgery

## 2017-02-01 ENCOUNTER — Encounter: Payer: Self-pay | Admitting: *Deleted

## 2017-02-01 ENCOUNTER — Encounter
Admission: RE | Disposition: A | Discharge status: Home / Self Care | Payer: Self-pay | Source: Ambulatory Visit | Attending: General Surgery

## 2017-02-01 DIAGNOSIS — K219 Gastro-esophageal reflux disease without esophagitis: Secondary | ICD-10-CM | POA: Diagnosis not present

## 2017-02-01 DIAGNOSIS — Z6841 Body Mass Index (BMI) 40.0 and over, adult: Secondary | ICD-10-CM | POA: Insufficient documentation

## 2017-02-01 DIAGNOSIS — Z8261 Family history of arthritis: Secondary | ICD-10-CM | POA: Insufficient documentation

## 2017-02-01 DIAGNOSIS — Z8249 Family history of ischemic heart disease and other diseases of the circulatory system: Secondary | ICD-10-CM | POA: Diagnosis not present

## 2017-02-01 DIAGNOSIS — K828 Other specified diseases of gallbladder: Secondary | ICD-10-CM | POA: Diagnosis not present

## 2017-02-01 DIAGNOSIS — Z79899 Other long term (current) drug therapy: Secondary | ICD-10-CM | POA: Insufficient documentation

## 2017-02-01 DIAGNOSIS — Z818 Family history of other mental and behavioral disorders: Secondary | ICD-10-CM | POA: Insufficient documentation

## 2017-02-01 DIAGNOSIS — J45909 Unspecified asthma, uncomplicated: Secondary | ICD-10-CM | POA: Diagnosis not present

## 2017-02-01 DIAGNOSIS — K811 Chronic cholecystitis: Secondary | ICD-10-CM | POA: Insufficient documentation

## 2017-02-01 DIAGNOSIS — Z8349 Family history of other endocrine, nutritional and metabolic diseases: Secondary | ICD-10-CM | POA: Diagnosis not present

## 2017-02-01 DIAGNOSIS — Z811 Family history of alcohol abuse and dependence: Secondary | ICD-10-CM | POA: Diagnosis not present

## 2017-02-01 DIAGNOSIS — I10 Essential (primary) hypertension: Secondary | ICD-10-CM | POA: Diagnosis not present

## 2017-02-01 HISTORY — PX: CHOLECYSTECTOMY: SHX55

## 2017-02-01 LAB — POCT PREGNANCY, URINE: PREG TEST UR: NEGATIVE

## 2017-02-01 SURGERY — LAPAROSCOPIC CHOLECYSTECTOMY
Anesthesia: General

## 2017-02-01 MED ORDER — SUCCINYLCHOLINE CHLORIDE 20 MG/ML IJ SOLN
INTRAMUSCULAR | Status: DC | PRN
  Administered 2017-02-01: 100 mg via INTRAVENOUS

## 2017-02-01 MED ORDER — CHLORHEXIDINE GLUCONATE CLOTH 2 % EX PADS: 6.0000 | MEDICATED_PAD | Freq: Once | CUTANEOUS | Status: DC

## 2017-02-01 MED ORDER — ONDANSETRON HCL 4 MG/2ML IJ SOLN
INTRAMUSCULAR | Status: DC | PRN
  Administered 2017-02-01 (×2): 4 mg via INTRAVENOUS

## 2017-02-01 MED ORDER — FENTANYL CITRATE (PF) 100 MCG/2ML IJ SOLN
25.0000 ug | INTRAMUSCULAR | Status: DC | PRN
  Administered 2017-02-01 (×3): 25 ug via INTRAVENOUS

## 2017-02-01 MED ORDER — HYDROCODONE-ACETAMINOPHEN 5-325 MG PO TABS: 1.0000 | ORAL_TABLET | Freq: Four times a day (QID) | ORAL | 0 refills | Status: DC | PRN

## 2017-02-01 MED ORDER — LIDOCAINE HCL (CARDIAC) 20 MG/ML IV SOLN
INTRAVENOUS | Status: DC | PRN
  Administered 2017-02-01: 50 mg via INTRAVENOUS

## 2017-02-01 MED ORDER — MIDAZOLAM HCL 2 MG/2ML IJ SOLN
INTRAMUSCULAR | Status: AC
  Filled 2017-02-01: qty 2

## 2017-02-01 MED ORDER — FENTANYL CITRATE (PF) 100 MCG/2ML IJ SOLN
INTRAMUSCULAR | Status: AC
  Administered 2017-02-01: 25 ug via INTRAVENOUS
  Filled 2017-02-01: qty 2

## 2017-02-01 MED ORDER — BUPIVACAINE HCL (PF) 0.5 % IJ SOLN
INTRAMUSCULAR | Status: AC
  Filled 2017-02-01: qty 30

## 2017-02-01 MED ORDER — ONDANSETRON HCL 4 MG/2ML IJ SOLN: 4.0000 mg | Freq: Once | INTRAMUSCULAR | Status: DC | PRN

## 2017-02-01 MED ORDER — DEXTROSE 5 % IV SOLN
1.0000 g | INTRAVENOUS | Status: AC
  Administered 2017-02-01: 1 g via INTRAVENOUS
  Filled 2017-02-01: qty 1

## 2017-02-01 MED ORDER — LIDOCAINE HCL 1 % IJ SOLN
INTRAMUSCULAR | Status: DC | PRN
  Administered 2017-02-01: 30 mL via SUBCUTANEOUS

## 2017-02-01 MED ORDER — LIDOCAINE HCL (PF) 1 % IJ SOLN
INTRAMUSCULAR | Status: AC
  Filled 2017-02-01: qty 30

## 2017-02-01 MED ORDER — FAMOTIDINE 20 MG PO TABS
ORAL_TABLET | ORAL | Status: AC
  Administered 2017-02-01: 20 mg via ORAL
  Filled 2017-02-01: qty 1

## 2017-02-01 MED ORDER — LACTATED RINGERS IV SOLN
INTRAVENOUS | Status: DC
  Administered 2017-02-01: 50 mL/h via INTRAVENOUS

## 2017-02-01 MED ORDER — HYDROCODONE-ACETAMINOPHEN 5-325 MG PO TABS
1.0000 | ORAL_TABLET | Freq: Four times a day (QID) | ORAL | Status: DC | PRN
  Administered 2017-02-01: 1 via ORAL

## 2017-02-01 MED ORDER — FENTANYL CITRATE (PF) 250 MCG/5ML IJ SOLN
INTRAMUSCULAR | Status: AC
  Filled 2017-02-01: qty 5

## 2017-02-01 MED ORDER — HYDROCODONE-ACETAMINOPHEN 5-325 MG PO TABS
ORAL_TABLET | ORAL | Status: AC
  Filled 2017-02-01: qty 1

## 2017-02-01 MED ORDER — DEXAMETHASONE SODIUM PHOSPHATE 10 MG/ML IJ SOLN
INTRAMUSCULAR | Status: DC | PRN
  Administered 2017-02-01: 10 mg via INTRAVENOUS

## 2017-02-01 MED ORDER — LACTATED RINGERS IV SOLN
INTRAVENOUS | Status: DC | PRN
  Administered 2017-02-01: 10:00:00 via INTRAVENOUS

## 2017-02-01 MED ORDER — PROPOFOL 10 MG/ML IV BOLUS
INTRAVENOUS | Status: DC | PRN
  Administered 2017-02-01: 300 mg via INTRAVENOUS

## 2017-02-01 MED ORDER — FENTANYL CITRATE (PF) 100 MCG/2ML IJ SOLN
INTRAMUSCULAR | Status: DC | PRN
  Administered 2017-02-01: 50 ug via INTRAVENOUS
  Administered 2017-02-01: 100 ug via INTRAVENOUS
  Administered 2017-02-01 (×2): 50 ug via INTRAVENOUS

## 2017-02-01 MED ORDER — ROCURONIUM BROMIDE 100 MG/10ML IV SOLN
INTRAVENOUS | Status: DC | PRN
  Administered 2017-02-01: 30 mg via INTRAVENOUS

## 2017-02-01 MED ORDER — DIPHENHYDRAMINE HCL 50 MG/ML IJ SOLN
INTRAMUSCULAR | Status: AC
  Filled 2017-02-01: qty 1

## 2017-02-01 MED ORDER — MIDAZOLAM HCL 2 MG/2ML IJ SOLN
INTRAMUSCULAR | Status: DC | PRN
  Administered 2017-02-01: 2 mg via INTRAVENOUS

## 2017-02-01 MED ORDER — FAMOTIDINE 20 MG PO TABS
20.0000 mg | ORAL_TABLET | Freq: Once | ORAL | Status: AC
  Administered 2017-02-01: 20 mg via ORAL

## 2017-02-01 SURGICAL SUPPLY — 48 items
ADHESIVE MASTISOL STRL (MISCELLANEOUS) ×3 IMPLANT
APPLICATOR ARISTA FLEXITIP XL (MISCELLANEOUS) ×3 IMPLANT
APPLIER CLIP ROT 10 11.4 M/L (STAPLE) ×3
BAG COUNTER SPONGE EZ (MISCELLANEOUS) IMPLANT
BLADE SURG SZ11 CARB STEEL (BLADE) ×3 IMPLANT
CANISTER SUCT 1200ML W/VALVE (MISCELLANEOUS) ×3 IMPLANT
CATH CHOLANG 76X19 KUMAR (CATHETERS) IMPLANT
CHLORAPREP W/TINT 26ML (MISCELLANEOUS) ×3 IMPLANT
CLIP APPLIE ROT 10 11.4 M/L (STAPLE) ×1 IMPLANT
CLOSURE WOUND 1/2 X4 (GAUZE/BANDAGES/DRESSINGS)
CONRAY 60ML FOR OR (MISCELLANEOUS) IMPLANT
COUNTER SPONGE BAG EZ (MISCELLANEOUS)
DECANTER SPIKE VIAL GLASS SM (MISCELLANEOUS) IMPLANT
DRAPE SHEET LG 3/4 BI-LAMINATE (DRAPES) ×3 IMPLANT
DRSG TEGADERM 2-3/8X2-3/4 SM (GAUZE/BANDAGES/DRESSINGS) ×12 IMPLANT
DRSG TELFA 4X3 1S NADH ST (GAUZE/BANDAGES/DRESSINGS) ×3 IMPLANT
ELECT REM PT RETURN 9FT ADLT (ELECTROSURGICAL) ×3
ELECTRODE REM PT RTRN 9FT ADLT (ELECTROSURGICAL) ×1 IMPLANT
GLOVE BIO SURGEON STRL SZ7.5 (GLOVE) ×3 IMPLANT
GLOVE INDICATOR 8.0 STRL GRN (GLOVE) ×3 IMPLANT
GOWN STRL REUS W/ TWL LRG LVL3 (GOWN DISPOSABLE) ×3 IMPLANT
GOWN STRL REUS W/TWL LRG LVL3 (GOWN DISPOSABLE) ×6
GRASPER SUT TROCAR 14GX15 (MISCELLANEOUS) IMPLANT
HEMOSTAT ARISTA ABSORB 3G PWDR (MISCELLANEOUS) ×3 IMPLANT
HEMOSTAT SURGICEL 2X3 (HEMOSTASIS) ×3 IMPLANT
IRRIGATION STRYKERFLOW (MISCELLANEOUS) ×1 IMPLANT
IRRIGATOR STRYKERFLOW (MISCELLANEOUS) ×3
IV NS 1000ML (IV SOLUTION) ×2
IV NS 1000ML BAXH (IV SOLUTION) ×1 IMPLANT
L-HOOK LAP DISP 36CM (ELECTROSURGICAL) ×3
LABEL OR SOLS (LABEL) ×3 IMPLANT
LHOOK LAP DISP 36CM (ELECTROSURGICAL) ×1 IMPLANT
NEEDLE HYPO 25X1 1.5 SAFETY (NEEDLE) ×3 IMPLANT
NEEDLE VERESS 14GA 120MM (NEEDLE) ×3 IMPLANT
NS IRRIG 500ML POUR BTL (IV SOLUTION) ×3 IMPLANT
PACK LAP CHOLECYSTECTOMY (MISCELLANEOUS) ×3 IMPLANT
PENCIL ELECTRO HAND CTR (MISCELLANEOUS) ×3 IMPLANT
POUCH ENDO CATCH 10MM SPEC (MISCELLANEOUS) ×3 IMPLANT
SCISSORS METZENBAUM CVD 33 (INSTRUMENTS) ×3 IMPLANT
SLEEVE ENDOPATH XCEL 5M (ENDOMECHANICALS) ×6 IMPLANT
STRIP CLOSURE SKIN 1/2X4 (GAUZE/BANDAGES/DRESSINGS) IMPLANT
SUT MNCRL 4-0 (SUTURE) ×2
SUT MNCRL 4-0 27XMFL (SUTURE) ×1
SUT VICRYL 0 AB UR-6 (SUTURE) IMPLANT
SUTURE MNCRL 4-0 27XMF (SUTURE) ×1 IMPLANT
TROCAR XCEL 12X100 BLDLESS (ENDOMECHANICALS) ×3 IMPLANT
TROCAR XCEL NON-BLD 5MMX100MML (ENDOMECHANICALS) ×3 IMPLANT
TUBING INSUFFLATOR HI FLOW (MISCELLANEOUS) ×3 IMPLANT

## 2017-02-01 NOTE — Anesthesia Post-op Follow-up Note (Cosign Needed)
Anesthesia QCDR form completed.        

## 2017-02-01 NOTE — Op Note (Signed)
Laparoscopic Cholecystectomy  Pre-operative Diagnosis: Biliary dyskinesia  Post-operative Diagnosis: Chronic cholecystitis  Procedure: Laparoscopic cholecystectomy  Surgeon: Naria Abbey T. Tonita CoLeonette MostngWoodham, MD FACS  Anesthesia: Gen. with endotracheal tube  Assistant: None  Procedure Details  The patient was seen again in the Holding Room. The benefits, complications, treatment options, and expected outcomes were discussed with the patient. The risks of bleeding, infection, recurrence of symptoms, failure to resolve symptoms, bile duct damage, bile duct leak, retained common bile duct stone, bowel injury, any of which could require further surgery and/or ERCP, stent, or papillotomy were reviewed with the patient. The likelihood of improving the patient's symptoms with return to their baseline status is good.  The patient and/or family concurred with the proposed plan, giving informed consent.  The patient was taken to Operating Room, identified as Ralene OkSarah Fessenden and the procedure verified as Laparoscopic Cholecystectomy.  A Time Out was held and the above information confirmed.  Prior to the induction of general anesthesia, antibiotic prophylaxis was administered. VTE prophylaxis was in place. General endotracheal anesthesia was then administered and tolerated well. After the induction, the abdomen was prepped with Chloraprep and draped in the sterile fashion. The patient was positioned in the supine position.  Local anesthetic was injected into the skin near the umbilicus and an incision made. The Veress needle was placed. Pneumoperitoneum was then created with CO2 and tolerated well without any adverse changes in the patient's vital signs. A 5mm port was placed in the periumbilical position and the abdominal cavity was explored.  Two 5-mm ports were placed in the right upper quadrant and a 12 mm epigastric port was placed all under direct vision. All skin incisions  were infiltrated with a local anesthetic  agent before making the incision and placing the trocars.   The patient was positioned  in reverse Trendelenburg, tilted slightly to the patient's left.  The gallbladder was identified, the fundus grasped and retracted cephalad. Adhesions were lysed bluntly and with electrocautery due to their dense nature. The gallbladder was noted to be intrahepatic. The infundibulum was grasped and retracted laterally, exposing the peritoneum overlying the triangle of Calot. This was then divided and exposed in a blunt fashion. A critical view of the cystic duct and cystic artery was obtained.  The cystic duct was clearly identified and bluntly dissected.   The duct and artery were noted to be running along the gallbladder where they were clipped with endoclips and cut in between the clips with endoscopic shears. The gallbladder was taken from the gallbladder fossa in a retrograde fashion with the electrocautery. The posterior wall of the gallbladder was inadvertently entered into during dissection of the gallbladder off the liver near the dome. The gallbladder was removed and placed in an Endocatch bag. The liver bed was irrigated and inspected. Hemostasis was mostly achieved with the electrocautery. Copious irrigation was utilized and was repeatedly aspirated until clear. A single area of bleeding was identified from the liver bed that was not controllable with electrocautery. Surgicel was placed and pressure was held for 2 minutes. This did not completely stop the bleeding so 3 g of Arista powder was placed under direct visualization into the gallbladder fossa at the site of bleeding. Hemostasis was then confirmed. The gallbladder and Endocatch sac were then removed through the epigastric port site.   Inspection of the right upper quadrant was performed. No bleeding, bile duct injury or leak, or bowel injury was noted. Pneumoperitoneum was released.  The epigastric port site was closed with figure-of-eight  0 Vicryl  sutures. 4-0 subcuticular Monocryl was used to close the skin. Steristrips and Mastisol and sterile dressings were  applied.  The patient was then extubated and brought to the recovery room in stable condition. Sponge, lap, and needle counts were correct at closure and at the conclusion of the case.   Findings: Chronic Cholecystitis   Estimated Blood Loss: 40 mL         Drains: None         Specimens: Gallbladder           Complications: none               Bernadine Melecio T. Tonita Cong, MD, FACS

## 2017-02-01 NOTE — Discharge Instructions (Signed)
Laparoscopic Cholecystectomy, Care After This sheet gives you information about how to care for yourself after your procedure. Your health care provider may also give you more specific instructions. If you have problems or questions, contact your health care provider. What can I expect after the procedure? After the procedure, it is common to have:  Pain at your incision sites. You will be given medicines to control this pain.  Mild nausea or vomiting.  Bloating and possible shoulder pain from the air-like gas that was used during the procedure. Follow these instructions at home: Incision care    Follow instructions from your health care provider about how to take care of your incisions. Make sure you:  Wash your hands with soap and water before you change your bandage (dressing). If soap and water are not available, use hand sanitizer.  Change your dressing as told by your health care provider.  Leave stitches (sutures), skin glue, or adhesive strips in place. These skin closures may need to be in place for 2 weeks or longer. If adhesive strip edges start to loosen and curl up, you may trim the loose edges. Do not remove adhesive strips completely unless your health care provider tells you to do that.  Do not take baths, swim, or use a hot tub until your health care provider approves. Ask your health care provider if you can take showers. You may only be allowed to take sponge baths for bathing. Okay to shower in 24 hours. Remove initial dressings, except Steri-Strips, and replaced with large Band-Aids in 48 hours.  Check your incision area every day for signs of infection. Check for:  More redness, swelling, or pain.  More fluid or blood.  Warmth.  Pus or a bad smell. Activity   Do not drive or use heavy machinery while taking prescription pain medicine.  Do not lift anything that is heavier than 10 lb (4.5 kg) until your health care provider approves.  Do not play contact  sports until your health care provider approves.  Do not drive for 24 hours if you were given a medicine to help you relax (sedative).  Rest as needed. Do not return to work or school until your health care provider approves. General instructions   Take over-the-counter and prescription medicines only as told by your health care provider.  To prevent or treat constipation while you are taking prescription pain medicine, your health care provider may recommend that you:  Drink enough fluid to keep your urine clear or pale yellow.  Take over-the-counter or prescription medicines.  Eat foods that are high in fiber, such as fresh fruits and vegetables, whole grains, and beans.  Limit foods that are high in fat and processed sugars, such as fried and sweet foods. Contact a health care provider if:  You develop a rash.  You have more redness, swelling, or pain around your incisions.  You have more fluid or blood coming from your incisions.  Your incisions feel warm to the touch.  You have pus or a bad smell coming from your incisions.  You have a fever.  One or more of your incisions breaks open. Get help right away if:  You have trouble breathing.  You have chest pain.  You have increasing pain in your shoulders.  You faint or feel dizzy when you stand.  You have severe pain in your abdomen.  You have nausea or vomiting that lasts for more than one day.  You have leg pain. This information  is not intended to replace advice given to you by your health care provider. Make sure you discuss any questions you have with your health care provider. Document Released: 09/11/2005 Document Revised: 04/01/2016 Document Reviewed: 02/28/2016 Elsevier Interactive Patient Education  2017 [employer]   AMBULATORY SURGERY  DISCHARGE INSTRUCTIONS   1) The drugs that you were given will stay in your system until tomorrow so for the next 24 hours you should not:  A) Drive an  automobile B) Make any legal decisions C) Drink any alcoholic beverage   2) You may resume regular meals tomorrow.  Today it is better to start with liquids and gradually work up to solid foods.  You may eat anything you prefer, but it is better to start with liquids, then soup and crackers, and gradually work up to solid foods.   3) Please notify your doctor immediately if you have any unusual bleeding, trouble breathing, redness and pain at the surgery site, drainage, fever, or pain not relieved by medication.   Please contact your physician with any problems or Same Day Surgery at [phone], Monday through Friday 6 am to 4 pm, or [place] at [facility] Main number at [phone].

## 2017-02-01 NOTE — Anesthesia Preprocedure Evaluation (Signed)
Anesthesia Evaluation  Patient identified by MRN, date of birth, ID band Patient awake    Reviewed: Allergy & Precautions, NPO status , Patient's Chart, lab work & pertinent test results, reviewed documented beta blocker date and time   Airway Mallampati: III  TM Distance: >3 FB     Dental  (+) Chipped   Pulmonary asthma ,           Cardiovascular hypertension, Pt. on medications      Neuro/Psych  Headaches,    GI/Hepatic GERD  Controlled,  Endo/Other  Morbid obesity  Renal/GU      Musculoskeletal   Abdominal   Peds  Hematology   Anesthesia Other Findings   Reproductive/Obstetrics                             Anesthesia Physical Anesthesia Plan  ASA: III  Anesthesia Plan: General   Post-op Pain Management:    Induction: Intravenous  Airway Management Planned: Oral ETT  Additional Equipment:   Intra-op Plan:   Post-operative Plan:   Informed Consent: I have reviewed the patients History and Physical, chart, labs and discussed the procedure including the risks, benefits and alternatives for the proposed anesthesia with the patient or authorized representative who has indicated his/her understanding and acceptance.     Plan Discussed with: CRNA  Anesthesia Plan Comments:         Anesthesia Quick Evaluation

## 2017-02-01 NOTE — Interval H&P Note (Signed)
History and Physical Interval Note:  02/01/2017 7:12 AM  Laurie Cameron  has presented today for surgery, with the diagnosis of BILIARY DYSKINESIA  The various methods of treatment have been discussed with the patient and family. After consideration of risks, benefits and other options for treatment, the patient has consented to  Procedure(s): LAPAROSCOPIC CHOLECYSTECTOMY (N/A) as a surgical intervention .  The patient's history has been reviewed, patient examined, no change in status, stable for surgery.  I have reviewed the patient's chart and labs.  Questions were answered to the patient's satisfaction.     Ricarda Frameharles Narcissus Detwiler

## 2017-02-01 NOTE — Brief Op Note (Signed)
02/01/2017  11:19 AM  PATIENT:  Laurie Cameron  31 y.o. female  PRE-OPERATIVE DIAGNOSIS:  BILIARY DYSKINESIA  POST-OPERATIVE DIAGNOSIS:  BILIARY DYSKINESIA  PROCEDURE:  Procedure(s): LAPAROSCOPIC CHOLECYSTECTOMY (N/A)  SURGEON:  Surgeon(s) and Role:    * Ricarda FrameWoodham, Cassie Shedlock, MD - Primary  PHYSICIAN ASSISTANT:   ASSISTANTS: none   ANESTHESIA:   general  EBL:  No intake/output data recorded.  BLOOD ADMINISTERED:none  DRAINS: none   LOCAL MEDICATIONS USED:  MARCAINE   , XYLOCAINE  and Amount: 30 ml  SPECIMEN:  Source of Specimen:  gallbladder  DISPOSITION OF SPECIMEN:  PATHOLOGY  COUNTS:  YES  TOURNIQUET:  * No tourniquets in log *  DICTATION: .Dragon Dictation  PLAN OF CARE: Discharge to home after PACU  PATIENT DISPOSITION:  PACU - hemodynamically stable.   Delay start of Pharmacological VTE agent (>24hrs) due to surgical blood loss or risk of bleeding: no

## 2017-02-01 NOTE — H&P (View-Only) (Signed)
Patient ID: Laurie Cameron, female   DOB: 08/15/1986, 31 y.o.   MRN: [id number]  CC: RUQ PAIN  HPI Laurie Cameron is a 31 y.o. female with a long-standing history of right upper quadrant pain. She was sent to surgery for evaluation by Dr. Anna from GI. She's had an extensive workup of her abdominal pain. Patient reports is always in the right upper quadrant is made worse by eating a fatty meal. It is better with occasional pain medications and walking around. She had a HIDA scan as an outpatient that acutely worsened her pain but diagnosed her with biliary dyskinesia. She denies any other symptoms. She denies any fevers, chills, nausea, vomiting, chest pain, shortness of breath, diarrhea, constipation. She is otherwise in her usual state of health. She strongly desires to be done with this pain and if removing her gallbladder does this she is willing to accept it.  HPI  Past Medical History:  Diagnosis Date  . Asthma   . Dizziness   . Elevated blood pressure reading 08/30/2016  . Frequent headaches   . GERD (gastroesophageal reflux disease)   . Hypertension   . RUQ pain 08/30/2016    History reviewed. No pertinent surgical history.  Family History  Problem Relation Age of Onset  . Arthritis Mother   . Hyperlipidemia Mother   . Hypertension Mother   . Arthritis Father   . Hyperlipidemia Father   . Heart disease Father   . Hypertension Father   . GER disease Father   . Alcohol abuse Paternal Uncle   . Cancer Paternal Uncle   . Arthritis Maternal Grandmother   . Cancer Maternal Grandmother   . Hyperlipidemia Maternal Grandmother   . Heart disease Maternal Grandmother   . Hypertension Maternal Grandmother   . Diabetes Maternal Grandmother   . Arthritis Maternal Grandfather   . Hyperlipidemia Maternal Grandfather   . Alcohol abuse Paternal Grandmother   . Arthritis Paternal Grandmother   . Hyperlipidemia Paternal Grandmother   . Hypertension Paternal Grandmother   . Mental illness  Paternal Grandmother   . Alcohol abuse Paternal Grandfather   . Arthritis Paternal Grandfather   . Hyperlipidemia Paternal Grandfather   . Cancer Maternal Aunt   . Hyperlipidemia Maternal Aunt   . Hypertension Maternal Aunt   . Diabetes Maternal Aunt   . Cancer Maternal Uncle   . Mental illness Paternal Aunt     Social History Social History  Substance Use Topics  . Smoking status: Never Smoker  . Smokeless tobacco: Never Used  . Alcohol use Yes    No Known Allergies  Current Outpatient Prescriptions  Medication Sig Dispense Refill  . amLODipine (NORVASC) 2.5 MG tablet Take 1 tablet (2.5 mg total) by mouth daily. 90 tablet 3   No current facility-administered medications for this visit.      Review of Systems A Multi-point review of systems was asked and was negative except for the findings documented in the history of present illness  Physical Exam Blood pressure 138/90, pulse 87, temperature 98.2 F (36.8 C), temperature source Oral, height 5' 7" (1.702 m), weight (!) 139.3 kg (307 lb 3.2 oz). CONSTITUTIONAL: No acute distress. EYES: Pupils are equal, round, and reactive to light, Sclera are non-icteric. EARS, NOSE, MOUTH AND THROAT: The oropharynx is clear. The oral mucosa is pink and moist. Hearing is intact to voice. LYMPH NODES:  Lymph nodes in the neck are normal. RESPIRATORY:  Lungs are clear. There is normal respiratory effort, with equal breath sounds   bilaterally, and without pathologic use of accessory muscles. CARDIOVASCULAR: Heart is regular without murmurs, gallops, or rubs. GI: The abdomen is very large, soft, minimally tender to deep palpation in the right upper quadrant with a negative Rovsing sign, and nondistended. There are no palpable masses. There is no hepatosplenomegaly. There are normal bowel sounds in all quadrants. GU: Rectal deferred.   MUSCULOSKELETAL: Normal muscle strength and tone. No cyanosis or edema.   SKIN: Turgor is good and there are  no pathologic skin lesions or ulcers. NEUROLOGIC: Motor and sensation is grossly normal. Cranial nerves are grossly intact. PSYCH:  Oriented to person, place and time. Affect is normal.  Data Reviewed Images and labs reviewed. Labs are all within normal limits, ultrasound the right upper quadrant was normal without any evidence of cholelithiasis or cholecystitis, HIDA scan showed biliary dyskinesia with a decreased ejection fraction of 26%. I have personally reviewed the patient's imaging, laboratory findings and medical records.    Assessment    Biliary dyskinesia    Plan    31 year old female with biliary dyskinesia. I discussed the procedure of a laparoscopic cholecystectomy in detail. I also discussed the possibility of using the robot for robotic assisted laparoscopic cholecystectomy. The patient was given Agricultural engineered[employer]ational material.  We discussed the risks and benefits of a laparoscopic cholecystectomy and possible cholangiogram including, but not limited to bleeding, infection, injury to surrounding structures such as the intestine or liver, bile leak, retained gallstones, need to convert to an open procedure, prolonged diarrhea, blood clots such as  DVT, common bile duct injury, anesthesia risks, and possible need for additional procedures.  The likelihood of improvement in symptoms and return to the patient's normal status is good. We discussed the typical post-operative recovery course. Patient voiced understanding. She states she's willing to have her gallbladder removed but she is unsure whether not she wants standard laparoscopy or the robot yet. We will plan to schedule her for surgery on May 10 and she will call the clinic within the next 2 weeks to say whether not she wants to use the [employer] robot or not.      Time spent with the patient was 45 minutes, with more than 50% of the time spent in face-to-face education, counseling and care coordination.     Ricarda Frameharles Kylie Simmonds, MD  FACS General Surgeon 01/04/2017, 3:30 PM

## 2017-02-01 NOTE — Transfer of Care (Signed)
Immediate Anesthesia Transfer of Care Note  Patient: Laurie Cameron  Procedure(s) Performed: Procedure(s): LAPAROSCOPIC CHOLECYSTECTOMY (N/A)  Patient Location: PACU  Anesthesia Type:General  Level of Consciousness: awake  Airway & Oxygen Therapy: Patient Spontanous Breathing  Post-op Assessment: Report given to RN  Post vital signs: stable  Last Vitals:  Vitals:   02/01/17 0621  BP: (!) 146/99  Pulse: 88  Resp: 16  Temp: 36.9 C    Last Pain:  Vitals:   02/01/17 0621  TempSrc: Tympanic  PainSc: 4       Patients Stated Pain Goal: 0 (02/01/17 16100621)  Complications: No apparent anesthesia complications

## 2017-02-01 NOTE — Anesthesia Procedure Notes (Signed)
Procedure Name: Intubation Date/Time: 02/01/2017 10:20 AM Performed by: Timoteo Expose Pre-anesthesia Checklist: Patient identified, Emergency Drugs available, Suction available, Patient being monitored and Timeout performed Patient Re-evaluated:Patient Re-evaluated prior to inductionOxygen Delivery Method: Circle system utilized Preoxygenation: Pre-oxygenation with 100% oxygen Intubation Type: IV induction Ventilation: Mask ventilation without difficulty Laryngoscope Size: Mac and 4 Grade View: Grade I Tube type: Oral Tube size: 7.5 mm Number of attempts: 1 Airway Equipment and Method: Patient positioned with wedge pillow Placement Confirmation: ETT inserted through vocal cords under direct vision,  positive ETCO2,  CO2 detector and breath sounds checked- equal and bilateral Secured at: 23 cm Tube secured with: Tape Dental Injury: Teeth and Oropharynx as per pre-operative assessment

## 2017-02-02 ENCOUNTER — Encounter: Payer: Self-pay | Admitting: General Surgery

## 2017-02-02 LAB — SURGICAL PATHOLOGY

## 2017-02-02 NOTE — Anesthesia Postprocedure Evaluation (Signed)
Anesthesia Post Note  Patient: Laurie Cameron  Procedure(s) Performed: Procedure(s) (LRB): LAPAROSCOPIC CHOLECYSTECTOMY (N/A)  Patient location during evaluation: PACU Anesthesia Type: General Level of consciousness: awake and alert Pain management: pain level controlled Vital Signs Assessment: post-procedure vital signs reviewed and stable Respiratory status: spontaneous breathing, nonlabored ventilation, respiratory function stable and patient connected to nasal cannula oxygen Cardiovascular status: blood pressure returned to baseline and stable Postop Assessment: no signs of nausea or vomiting Anesthetic complications: no     Last Vitals:  Vitals:   02/01/17 1246 02/01/17 1310  BP: 126/69 127/75  Pulse: 93 (!) 101  Resp: 16 16  Temp: 36.6 C     Last Pain:  Vitals:   02/01/17 1329  TempSrc:   PainSc: 4                  Lenard SimmerAndrew Dona Klemann

## 2017-02-09 ENCOUNTER — Encounter: Payer: Self-pay | Admitting: General Surgery

## 2017-02-09 ENCOUNTER — Ambulatory Visit (INDEPENDENT_AMBULATORY_CARE_PROVIDER_SITE_OTHER): Payer: 59 | Admitting: General Surgery

## 2017-02-09 VITALS — BP 140/99 | HR 112 | Temp 98.6°F | Ht 67.0 in | Wt 297.0 lb

## 2017-02-09 DIAGNOSIS — Z4889 Encounter for other specified surgical aftercare: Secondary | ICD-10-CM

## 2017-02-09 NOTE — Patient Instructions (Signed)
Please call our office with any questions or concerns.  Please do not submerge in a tub, hot tub, or pool until incisions are completely sealed.  Use sun block to incision area over the next year if this area will be exposed to sun. This helps decrease scarring.  You may resume your normal activities on 03/15/17. At that time- Listen to your body when lifting, if you have pain when lifting, stop and then try again in a few days. Pain after doing exercises or activities of daily living is normal as you get back in to your normal routine.  If you develop redness, drainage, or pain at incision sites- call our office immediately and speak with a nurse.  

## 2017-02-09 NOTE — Progress Notes (Signed)
Outpatient Surgical Follow Up  02/09/2017  Laurie Cameron is an 31 y.o. female.   Chief Complaint  Patient presents with  . Routine Post Op    Laparoscopic Cholecystectomy (02/01/17)- Dr. Tonita Cong    HPI: 30 year old female returns to clinic 8 days status post laparoscopic cholecystectomy. Patient reports doing very well. She denies any pain. She is eating well and having normal bowel function. She denies any fevers, chills, nausea, vomiting, chest pain, shortness breath, diarrhea, constant patient.  Past Medical History:  Diagnosis Date  . Asthma    AS A CHILD- NO INHALERS  . Dizziness    happened when first diagnosed with hypertension  . Elevated blood pressure reading 08/30/2016  . Frequent headaches   . GERD (gastroesophageal reflux disease)    OCC-TUMS PRN  . Hypertension   . RUQ pain 08/30/2016    Past Surgical History:  Procedure Laterality Date  . CHOLECYSTECTOMY N/A 02/01/2017   Procedure: LAPAROSCOPIC CHOLECYSTECTOMY;  Surgeon: Ricarda Frame, MD;  Location: [facility] ORS;  Service: General;  Laterality: N/A;  . NO PAST SURGERIES      Family History  Problem Relation Age of Onset  . Arthritis Mother   . Hyperlipidemia Mother   . Hypertension Mother   . Arthritis Father   . Hyperlipidemia Father   . Heart disease Father   . Hypertension Father   . GER disease Father   . Alcohol abuse Paternal Uncle   . Cancer Paternal Uncle   . Arthritis Maternal Grandmother   . Cancer Maternal Grandmother   . Hyperlipidemia Maternal Grandmother   . Heart disease Maternal Grandmother   . Hypertension Maternal Grandmother   . Diabetes Maternal Grandmother   . Arthritis Maternal Grandfather   . Hyperlipidemia Maternal Grandfather   . Alcohol abuse Paternal Grandmother   . Arthritis Paternal Grandmother   . Hyperlipidemia Paternal Grandmother   . Hypertension Paternal Grandmother   . Mental illness Paternal Grandmother   . Alcohol abuse Paternal Grandfather   . Arthritis  Paternal Grandfather   . Hyperlipidemia Paternal Grandfather   . Cancer Maternal Aunt   . Hyperlipidemia Maternal Aunt   . Hypertension Maternal Aunt   . Diabetes Maternal Aunt   . Cancer Maternal Uncle   . Mental illness Paternal Aunt     Social History:  reports that she has never smoked. She has never used smokeless tobacco. She reports that she drinks alcohol. She reports that she does not use drugs.  Allergies: No Known Allergies  Medications reviewed.    ROS A multipoint review of systems was completed. All pertinent positives and negatives are documented in the history of present illness and the remainder are negative.   BP (!) 140/99   Pulse (!) 112   Temp 98.6 F (37 C) (Oral)   Ht 5\' 7"  (1.702 m)   Wt 134.7 kg (297 lb)   LMP 01/17/2017 (Approximate)   BMI 46.52 kg/m   Physical Exam Gen.: No acute distress Chest: Clear to sedation Heart: Tachycardic Abdomen: Large, soft, nontender. Well approximated laparoscopic incision sites on the evidence of erythema or drainage.    No results found for this or any previous visit (from the past 48 hour(s)). No results found.  Assessment/Plan:  1. Aftercare following surgery 31 year old female status post laparoscopic cholecystectomy. Doing very well. Reviewed her pathology. Discussed standard postoperative precautions anticipation of returning to his normal activities. She voiced understanding and will follow-up in clinic on an as-needed basis.     Ricarda Frame, MD  FACS General Surgeon  02/09/2017,11:12 AM

## 2017-07-02 ENCOUNTER — Other Ambulatory Visit: Payer: Self-pay | Admitting: Family

## 2017-07-02 ENCOUNTER — Ambulatory Visit (INDEPENDENT_AMBULATORY_CARE_PROVIDER_SITE_OTHER): Payer: 59 | Admitting: Family

## 2017-07-02 ENCOUNTER — Encounter: Payer: Self-pay | Admitting: Family

## 2017-07-02 VITALS — BP 146/92 | HR 100 | Temp 99.3°F | Ht 67.0 in | Wt 311.8 lb

## 2017-07-02 DIAGNOSIS — R030 Elevated blood-pressure reading, without diagnosis of hypertension: Secondary | ICD-10-CM | POA: Diagnosis not present

## 2017-07-02 DIAGNOSIS — Z0000 Encounter for general adult medical examination without abnormal findings: Secondary | ICD-10-CM

## 2017-07-02 MED ORDER — AMLODIPINE BESYLATE 2.5 MG PO TABS: 2.5000 mg | ORAL_TABLET | Freq: Every day | ORAL | 2 refills | Status: DC

## 2017-07-02 NOTE — Assessment & Plan Note (Signed)
Pap and clinical breast exam performed today. Patient declines referral to dermatology for annual skin check. We discussed her aunts early breast cancer age 31, patient politely declines mammogram at this time.

## 2017-07-02 NOTE — Patient Instructions (Signed)
Pleasure seeing you  Health Maintenance, Female Adopting a healthy lifestyle and getting preventive care can go a long way to promote health and wellness. Talk with your health care provider about what schedule of regular examinations is right for you. This is a good chance for you to check in with your provider about disease prevention and staying healthy. In between checkups, there are plenty of things you can do on your own. Experts have done a lot of research about which lifestyle changes and preventive measures are most likely to keep you healthy. Ask your health care provider for more information. Weight and diet Eat a healthy diet  Be sure to include plenty of vegetables, fruits, low-fat dairy products, and lean protein.  Do not eat a lot of foods high in solid fats, added sugars, or salt.  Get regular exercise. This is one of the most important things you can do for your health. ? Most adults should exercise for at least 150 minutes each week. The exercise should increase your heart rate and make you sweat (moderate-intensity exercise). ? Most adults should also do strengthening exercises at least twice a week. This is in addition to the moderate-intensity exercise.  Maintain a healthy weight  Body mass index (BMI) is a measurement that can be used to identify possible weight problems. It estimates body fat based on height and weight. Your health care provider can help determine your BMI and help you achieve or maintain a healthy weight.  For females 20 years of age and older: ? A BMI below 18.5 is considered underweight. ? A BMI of 18.5 to 24.9 is normal. ? A BMI of 25 to 29.9 is considered overweight. ? A BMI of 30 and above is considered obese.  Watch levels of cholesterol and blood lipids  You should start having your blood tested for lipids and cholesterol at 32 years of age, then have this test every 5 years.  You may need to have your cholesterol levels checked more often  if: ? Your lipid or cholesterol levels are high. ? You are older than 32 years of age. ? You are at high risk for heart disease.  Cancer screening Lung Cancer  Lung cancer screening is recommended for adults 55-80 years old who are at high risk for lung cancer because of a history of smoking.  A yearly low-dose CT scan of the lungs is recommended for people who: ? Currently smoke. ? Have quit within the past 15 years. ? Have at least a 30-pack-year history of smoking. A pack year is smoking an average of one pack of cigarettes a day for 1 year.  Yearly screening should continue until it has been 15 years since you quit.  Yearly screening should stop if you develop a health problem that would prevent you from having lung cancer treatment.  Breast Cancer  Practice breast self-awareness. This means understanding how your breasts normally appear and feel.  It also means doing regular breast self-exams. Let your health care provider know about any changes, no matter how small.  If you are in your 20s or 30s, you should have a clinical breast exam (CBE) by a health care provider every 1-3 years as part of a regular health exam.  If you are 40 or older, have a CBE every year. Also consider having a breast X-ray (mammogram) every year.  If you have a family history of breast cancer, talk to your health care provider about genetic screening.  If you   are at high risk for breast cancer, talk to your health care provider about having an MRI and a mammogram every year.  Breast cancer gene (BRCA) assessment is recommended for women who have family members with BRCA-related cancers. BRCA-related cancers include: ? Breast. ? Ovarian. ? Tubal. ? Peritoneal cancers.  Results of the assessment will determine the need for genetic counseling and BRCA1 and BRCA2 testing.  Cervical Cancer Your health care provider may recommend that you be screened regularly for cancer of the pelvic organs  (ovaries, uterus, and vagina). This screening involves a pelvic examination, including checking for microscopic changes to the surface of your cervix (Pap test). You may be encouraged to have this screening done every 3 years, beginning at age 72.  For women ages 67-65, health care providers may recommend pelvic exams and Pap testing every 3 years, or they may recommend the Pap and pelvic exam, combined with testing for human papilloma virus (HPV), every 5 years. Some types of HPV increase your risk of cervical cancer. Testing for HPV may also be done on women of any age with unclear Pap test results.  Other health care providers may not recommend any screening for nonpregnant women who are considered low risk for pelvic cancer and who do not have symptoms. Ask your health care provider if a screening pelvic exam is right for you.  If you have had past treatment for cervical cancer or a condition that could lead to cancer, you need Pap tests and screening for cancer for at least 20 years after your treatment. If Pap tests have been discontinued, your risk factors (such as having a new sexual partner) need to be reassessed to determine if screening should resume. Some women have medical problems that increase the chance of getting cervical cancer. In these cases, your health care provider may recommend more frequent screening and Pap tests.  Colorectal Cancer  This type of cancer can be detected and often prevented.  Routine colorectal cancer screening usually begins at 32 years of age and continues through 32 years of age.  Your health care provider may recommend screening at an earlier age if you have risk factors for colon cancer.  Your health care provider may also recommend using home test kits to check for hidden blood in the stool.  A small camera at the end of a tube can be used to examine your colon directly (sigmoidoscopy or colonoscopy). This is done to check for the earliest forms of  colorectal cancer.  Routine screening usually begins at age 74.  Direct examination of the colon should be repeated every 5-10 years through 32 years of age. However, you may need to be screened more often if early forms of precancerous polyps or small growths are found.  Skin Cancer  Check your skin from head to toe regularly.  Tell your health care provider about any new moles or changes in moles, especially if there is a change in a mole's shape or color.  Also tell your health care provider if you have a mole that is larger than the size of a pencil eraser.  Always use sunscreen. Apply sunscreen liberally and repeatedly throughout the day.  Protect yourself by wearing long sleeves, pants, a wide-brimmed hat, and sunglasses whenever you are outside.  Heart disease, diabetes, and high blood pressure  High blood pressure causes heart disease and increases the risk of stroke. High blood pressure is more likely to develop in: ? People who have blood pressure in  the high end of the normal range (130-139/85-89 mm Hg). ? People who are overweight or obese. ? People who are African American.  If you are 18-39 years of age, have your blood pressure checked every 3-5 years. If you are 40 years of age or older, have your blood pressure checked every year. You should have your blood pressure measured twice-once when you are at a hospital or clinic, and once when you are not at a hospital or clinic. Record the average of the two measurements. To check your blood pressure when you are not at a hospital or clinic, you can use: ? An automated blood pressure machine at a pharmacy. ? A home blood pressure monitor.  If you are between 55 years and 79 years old, ask your health care provider if you should take aspirin to prevent strokes.  Have regular diabetes screenings. This involves taking a blood sample to check your fasting blood sugar level. ? If you are at a normal weight and have a low risk  for diabetes, have this test once every three years after 32 years of age. ? If you are overweight and have a high risk for diabetes, consider being tested at a younger age or more often. Preventing infection Hepatitis B  If you have a higher risk for hepatitis B, you should be screened for this virus. You are considered at high risk for hepatitis B if: ? You were born in a country where hepatitis B is common. Ask your health care provider which countries are considered high risk. ? Your parents were born in a high-risk country, and you have not been immunized against hepatitis B (hepatitis B vaccine). ? You have HIV or AIDS. ? You use needles to inject street drugs. ? You live with someone who has hepatitis B. ? You have had sex with someone who has hepatitis B. ? You get hemodialysis treatment. ? You take certain medicines for conditions, including cancer, organ transplantation, and autoimmune conditions.  Hepatitis C  Blood testing is recommended for: ? Everyone born from 1945 through 1965. ? Anyone with known risk factors for hepatitis C.  Sexually transmitted infections (STIs)  You should be screened for sexually transmitted infections (STIs) including gonorrhea and chlamydia if: ? You are sexually active and are younger than 32 years of age. ? You are older than 32 years of age and your health care provider tells you that you are at risk for this type of infection. ? Your sexual activity has changed since you were last screened and you are at an increased risk for chlamydia or gonorrhea. Ask your health care provider if you are at risk.  If you do not have HIV, but are at risk, it may be recommended that you take a prescription medicine daily to prevent HIV infection. This is called pre-exposure prophylaxis (PrEP). You are considered at risk if: ? You are sexually active and do not regularly use condoms or know the HIV status of your partner(s). ? You take drugs by  injection. ? You are sexually active with a partner who has HIV.  Talk with your health care provider about whether you are at high risk of being infected with HIV. If you choose to begin PrEP, you should first be tested for HIV. You should then be tested every 3 months for as long as you are taking PrEP. Pregnancy  If you are premenopausal and you may become pregnant, ask your health care provider about preconception counseling.    If you may become pregnant, take 400 to 800 micrograms (mcg) of folic acid every day.  If you want to prevent pregnancy, talk to your health care provider about birth control (contraception). Osteoporosis and menopause  Osteoporosis is a disease in which the bones lose minerals and strength with aging. This can result in serious bone fractures. Your risk for osteoporosis can be identified using a bone density scan.  If you are 17 years of age or older, or if you are at risk for osteoporosis and fractures, ask your health care provider if you should be screened.  Ask your health care provider whether you should take a calcium or vitamin D supplement to lower your risk for osteoporosis.  Menopause may have certain physical symptoms and risks.  Hormone replacement therapy may reduce some of these symptoms and risks. Talk to your health care provider about whether hormone replacement therapy is right for you. Follow these instructions at home:  Schedule regular health, dental, and eye exams.  Stay current with your immunizations.  Do not use any tobacco products including cigarettes, chewing tobacco, or electronic cigarettes.  If you are pregnant, do not drink alcohol.  If you are breastfeeding, limit how much and how often you drink alcohol.  Limit alcohol intake to no more than 1 drink per day for nonpregnant women. One drink equals 12 ounces of beer, 5 ounces of wine, or 1 ounces of hard liquor.  Do not use street drugs.  Do not share needles.  Ask  your health care provider for help if you need support or information about quitting drugs.  Tell your health care provider if you often feel depressed.  Tell your health care provider if you have ever been abused or do not feel safe at home. This information is not intended to replace advice given to you by your health care provider. Make sure you discuss any questions you have with your health care provider. Document Released: 03/27/2011 Document Revised: 02/17/2016 Document Reviewed: 06/15/2015 Elsevier Interactive Patient Education  [employer].

## 2017-07-02 NOTE — Progress Notes (Signed)
Subjective:    Patient ID: Laurie Cameron, female    DOB: [birth year], 31 y.o.   MRN: [id number]  CC: Laurie Cameron is a 31 y.o. female who presents today for physical exam.    HPI: HTN- compliant with medication. Averages 135/80 Denies exertional chest pain or pressure, numbness or tingling radiating to left arm or jaw, palpitations, dizziness, frequent headaches, changes in vision, or shortness of breath.      Colorectal Cancer Screening: no family hitstory Breast Cancer Screening: would like to defer mammogram, despite aunt's age of 63,  Cervical Cancer Screening: due Bone Health screening/DEXA for 65+: No increased fracture risk. Defer screening at this time. Lung Cancer Screening: Doesn't have 30 year pack year history and age > 55 years.       Tetanus - utd        Labs: Screening labs today. Exercise: Gets regular exercise.  Alcohol use: very occassional Smoking/tobacco use: Nonsmoker.  Regular dental exams: UTD Wears seat belt: Yes. Skin: no h/o skin cancer; no new lesions   HISTORY:  Past Medical History:  Diagnosis Date  . Asthma    AS A CHILD- NO INHALERS  . Dizziness    happened when first diagnosed with hypertension  . Elevated blood pressure reading 08/30/2016  . Frequent headaches   . GERD (gastroesophageal reflux disease)    OCC-TUMS PRN  . Hypertension   . RUQ pain 08/30/2016    Past Surgical History:  Procedure Laterality Date  . CHOLECYSTECTOMY N/A 02/01/2017   Procedure: LAPAROSCOPIC CHOLECYSTECTOMY;  Surgeon: Ricarda Frame, MD;  Location: [facility] ORS;  Service: General;  Laterality: N/A;  . NO PAST SURGERIES     Family History  Problem Relation Age of Onset  . Arthritis Mother   . Hyperlipidemia Mother   . Hypertension Mother   . Arthritis Father   . Hyperlipidemia Father   . Heart disease Father   . Hypertension Father   . GER disease Father   . Alcohol abuse Paternal Uncle   . Cancer Paternal Uncle   . Arthritis Maternal Grandmother     . Hyperlipidemia Maternal Grandmother   . Heart disease Maternal Grandmother   . Hypertension Maternal Grandmother   . Diabetes Maternal Grandmother   . Breast cancer Maternal Grandmother 45  . Arthritis Maternal Grandfather   . Hyperlipidemia Maternal Grandfather   . Alcohol abuse Paternal Grandmother   . Arthritis Paternal Grandmother   . Hyperlipidemia Paternal Grandmother   . Hypertension Paternal Grandmother   . Mental illness Paternal Grandmother   . Alcohol abuse Paternal Grandfather   . Arthritis Paternal Grandfather   . Hyperlipidemia Paternal Grandfather   . Hyperlipidemia Maternal Aunt   . Hypertension Maternal Aunt   . Diabetes Maternal Aunt   . Breast cancer Maternal Aunt 40  . Cancer Maternal Uncle   . Mental illness Paternal Aunt   . Colon cancer Neg Hx       ALLERGIES: Patient has no known allergies.  Current Outpatient Prescriptions on File Prior to Visit  Medication Sig Dispense Refill  . calcium carbonate (TUMS - DOSED IN MG ELEMENTAL CALCIUM) 500 MG chewable tablet Chew 1 tablet by mouth as needed for indigestion or heartburn.    Marland Kitchen ibuprofen (ADVIL,MOTRIN) 200 MG tablet Take 400 mg by mouth every 6 (six) hours as needed for mild pain.    . Multiple Vitamins-Minerals (EQ MULTIVITAMINS ADULT GUMMY PO) Take 2 tablets by mouth daily.     No current facility-administered medications on file  prior to visit.     Social History  Substance Use Topics  . Smoking status: Never Smoker  . Smokeless tobacco: Never Used  . Alcohol use Yes     Comment: RARE    Review of Systems  Constitutional: Negative for chills, fever and unexpected weight change.  HENT: Negative for congestion.   Respiratory: Negative for cough.   Cardiovascular: Negative for chest pain, palpitations and leg swelling.  Gastrointestinal: Negative for nausea and vomiting.  Musculoskeletal: Negative for arthralgias and myalgias.  Skin: Negative for rash.  Neurological: Negative for  headaches.  Hematological: Negative for adenopathy.  Psychiatric/Behavioral: Negative for confusion.      Objective:    BP (!) 146/92   Pulse 100   Temp 99.3 F (37.4 C) (Oral)   Ht  (1.702 m)   Wt (!) 311 lb 12.8 oz (141.4 kg)   LMP 06/08/2017 (Exact Date)   SpO2 98%   BMI 48.83 kg/m   BP Readings from Last 3 Encounters:  07/02/17 (!) 146/92  02/09/17 (!) 140/99  02/01/17 127/75   Wt Readings from Last 3 Encounters:  07/02/17 (!) 311 lb 12.8 oz (141.4 kg)  02/09/17 297 lb (134.7 kg)  02/01/17 (!) 307 lb (139.3 kg)    Physical Exam  Constitutional: She appears well-developed and well-nourished.  Eyes: Conjunctivae are normal.  Neck: No thyroid mass and no thyromegaly present.  Cardiovascular: Normal rate, regular rhythm, normal heart sounds and normal pulses.   Pulmonary/Chest: Effort normal and breath sounds normal. She has no wheezes. She has no rhonchi. She has no rales. Right breast exhibits no inverted nipple, no mass, no nipple discharge, no skin change and no tenderness. Left breast exhibits no inverted nipple, no mass, no nipple discharge, no skin change and no tenderness. Breasts are symmetrical.  No masses or asymmetry appreciated during CBE.  Genitourinary: Uterus is not enlarged, not fixed and not tender. Cervix exhibits no motion tenderness, no discharge and no friability. Right adnexum displays no mass, no tenderness and no fullness. Left adnexum displays no mass, no tenderness and no fullness.  Genitourinary Comments: Pap performed. No CMT. Unable to appreciated ovaries.  Lymphadenopathy:       Head (right side): No submental, no submandibular, no tonsillar, no preauricular, no posterior auricular and no occipital adenopathy present.       Head (left side): No submental, no submandibular, no tonsillar, no preauricular, no posterior auricular and no occipital adenopathy present.       Right cervical: No superficial cervical, no deep cervical and no  posterior cervical adenopathy present.      Left cervical: No superficial cervical, no deep cervical and no posterior cervical adenopathy present.    She has no axillary adenopathy.       Right axillary: No pectoral and no lateral adenopathy present.       Left axillary: No pectoral and no lateral adenopathy present. Neurological: She is alert.  Skin: Skin is warm and dry.  Psychiatric: She has a normal mood and affect. Her speech is normal and behavior is normal. Thought content normal.  Vitals reviewed.      Assessment & Plan:   Problem List Items Addressed This Visit      Cardiovascular and Mediastinum   HTN (hypertension)    Elevated. Advised patient to keep log and we will decide if need to increase amlodipine. She understands if elevated to let me know and we will increase amlodipine to   Relevant Medications   amLODipine (NORVASC) 2.5 MG tablet     Other   Routine physical examination - Primary    Pap and clinical breast exam performed today. Patient declines referral to dermatology for annual skin check. We discussed her aunts early breast cancer age 78, patient politely declines mammogram at this time.       Relevant Medications   amLODipine (NORVASC) 2.5 MG tablet   Other Relevant Orders   CBC with Differential/Platelet   Comprehensive metabolic panel   Hemoglobin A1c   Lipid panel   TSH   VITAMIN D 25 Hydroxy (Vit-D Deficiency, Fractures)   Cytology - PAP       I have discontinued Ms. Kawai's HYDROcodone-acetaminophen. I am also having her maintain her Multiple Vitamins-Minerals (EQ MULTIVITAMINS ADULT GUMMY PO), ibuprofen, calcium carbonate, and amLODipine.   Meds ordered this encounter  Medications  . amLODipine (NORVASC) 2.5 MG tablet    Sig: Take 1 tablet (2.5 mg total) by mouth daily.    Dispense:  90 tablet    Refill:  2    Order Specific Question:   Supervising Provider    Answer:   Sherlene Shams [[id number]]    Return precautions given.    Risks, benefits, and alternatives of the medications and treatment plan prescribed today were discussed, and patient expressed understanding.   Education regarding symptom management and diagnosis given to patient on AVS.   Continue to follow with Allegra Grana, FNP for routine health maintenance.   Ralene Ok and I agreed with plan.   [name], FNP

## 2017-07-02 NOTE — Assessment & Plan Note (Addendum)
Elevated. Advised patient to keep log and we will decide if need to increase amlodipine. She understands if elevated to let me know and we will increase amlodipine to 

## 2017-07-02 NOTE — Progress Notes (Signed)
Pre visit review using our clinic review tool, if applicable. No additional management support is needed unless otherwise documented below in the visit note. 

## 2017-07-03 LAB — CBC WITH DIFFERENTIAL/PLATELET
Basophils Absolute: 0 10*3/uL (ref 0.0–0.2)
Basos: 0 %
EOS (ABSOLUTE): 0.2 10*3/uL (ref 0.0–0.4)
Eos: 2 %
HEMATOCRIT: 39.8 % (ref 34.0–46.6)
HEMOGLOBIN: 13 g/dL (ref 11.1–15.9)
IMMATURE GRANS (ABS): 0 10*3/uL (ref 0.0–0.1)
Immature Granulocytes: 0 %
LYMPHS ABS: 2.9 10*3/uL (ref 0.7–3.1)
Lymphs: 29 %
MCH: 28 pg (ref 26.6–33.0)
MCHC: 32.7 g/dL (ref 31.5–35.7)
MCV: 86 fL (ref 79–97)
Monocytes Absolute: 0.3 10*3/uL (ref 0.1–0.9)
Monocytes: 4 %
NEUTROS ABS: 6.4 10*3/uL (ref 1.4–7.0)
Neutrophils: 65 %
Platelets: 325 10*3/uL (ref 150–379)
RBC: 4.64 x10E6/uL (ref 3.77–5.28)
RDW: 13.5 % (ref 12.3–15.4)
WBC: 9.8 10*3/uL (ref 3.4–10.8)

## 2017-07-03 LAB — COMPREHENSIVE METABOLIC PANEL
ALBUMIN: 4.6 g/dL (ref 3.5–5.5)
ALK PHOS: 48 IU/L (ref 39–117)
ALT: 17 IU/L (ref 0–32)
AST: 22 IU/L (ref 0–40)
Albumin/Globulin Ratio: 1.7 (ref 1.2–2.2)
BUN / CREAT RATIO: 18 (ref 9–23)
BUN: 14 mg/dL (ref 6–20)
Bilirubin Total: 0.5 mg/dL (ref 0.0–1.2)
CO2: 22 mmol/L (ref 20–29)
CREATININE: 0.8 mg/dL (ref 0.57–1.00)
Calcium: 9.5 mg/dL (ref 8.7–10.2)
Chloride: 100 mmol/L (ref 96–106)
GFR calc Af Amer: 113 mL/min/{1.73_m2} (ref >59)
GFR calc non Af Amer: 98 mL/min/{1.73_m2} (ref >59)
GLUCOSE: 98 mg/dL (ref 65–99)
Globulin, Total: 2.7 g/dL (ref 1.5–4.5)
Potassium: 4.3 mmol/L (ref 3.5–5.2)
Sodium: 139 mmol/L (ref 134–144)
TOTAL PROTEIN: 7.3 g/dL (ref 6.0–8.5)

## 2017-07-03 LAB — HEMOGLOBIN A1C
Est. average glucose Bld gHb Est-mCnc: 128 mg/dL
HEMOGLOBIN A1C: 6.1 % — AB (ref 4.8–5.6)

## 2017-07-03 LAB — LIPID PANEL
CHOL/HDL RATIO: 4.3 ratio (ref 0.0–4.4)
Cholesterol, Total: 231 mg/dL — ABNORMAL HIGH (ref 100–199)
HDL: 54 mg/dL (ref >39)
LDL Calculated: 131 mg/dL — ABNORMAL HIGH (ref 0–99)
Triglycerides: 230 mg/dL — ABNORMAL HIGH (ref 0–149)
VLDL Cholesterol Cal: 46 mg/dL — ABNORMAL HIGH (ref 5–40)

## 2017-07-03 LAB — VITAMIN D 25 HYDROXY (VIT D DEFICIENCY, FRACTURES): Vit D, 25-Hydroxy: 23.6 ng/mL — ABNORMAL LOW (ref 30.0–100.0)

## 2017-07-03 LAB — TSH: TSH: 5.82 u[IU]/mL — ABNORMAL HIGH (ref 0.450–4.500)

## 2017-07-05 ENCOUNTER — Other Ambulatory Visit: Payer: Self-pay | Admitting: Family

## 2017-07-05 DIAGNOSIS — E039 Hypothyroidism, unspecified: Secondary | ICD-10-CM

## 2017-07-05 LAB — PAP LB AND HPV HIGH-RISK
HPV, high-risk: NEGATIVE
PAP SMEAR COMMENT: 0

## 2017-07-09 ENCOUNTER — Telehealth: Payer: Self-pay | Admitting: Family

## 2017-07-09 NOTE — Telephone Encounter (Signed)
Toya,  Pap results on this patient are resulted very strange this time. Normally a pdf link is included which I can click as well.  Seems to have conflicting info on the pap- states negative and then below says squamous metaplastic present  ( tried to copy below)   NEGATIVE FOR INTRAEPITHELIAL LESION AND MALIGNANCY.  Specimen adequacy:  Comment   Comment: Satisfactory for evaluation. 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

## 2017-07-09 NOTE — Telephone Encounter (Signed)
It's their terminology-wheras our labs states that adequate cell were obtained for evaluation........they just word it differently. HPV is; Negative. Result is normal.

## 2017-07-19 ENCOUNTER — Other Ambulatory Visit (INDEPENDENT_AMBULATORY_CARE_PROVIDER_SITE_OTHER): Payer: 59

## 2017-07-19 DIAGNOSIS — E039 Hypothyroidism, unspecified: Secondary | ICD-10-CM | POA: Diagnosis not present

## 2017-07-19 NOTE — Addendum Note (Signed)
Addended by: Warden FillersWRIGHT, LATOYA S on: 07/19/2017 04:00 PM   Modules accepted: Orders

## 2017-07-20 LAB — T3, FREE: T3 FREE: 2.6 pg/mL (ref 2.0–4.4)

## 2017-07-20 LAB — TSH: TSH: 5.71 u[IU]/mL — AB (ref 0.450–4.500)

## 2017-07-20 LAB — T4, FREE: Free T4: 0.84 ng/dL (ref 0.82–1.77)

## 2017-08-21 ENCOUNTER — Other Ambulatory Visit: Payer: Self-pay | Admitting: Family

## 2017-08-21 DIAGNOSIS — R030 Elevated blood-pressure reading, without diagnosis of hypertension: Secondary | ICD-10-CM

## 2017-12-05 ENCOUNTER — Encounter: Payer: Self-pay | Admitting: Family

## 2017-12-05 ENCOUNTER — Ambulatory Visit: Payer: Managed Care, Other (non HMO) | Admitting: Family

## 2017-12-05 VITALS — BP 162/100 | HR 105 | Temp 98.5°F | Wt 322.4 lb

## 2017-12-05 DIAGNOSIS — R7989 Other specified abnormal findings of blood chemistry: Secondary | ICD-10-CM

## 2017-12-05 DIAGNOSIS — Z832 Family history of diseases of the blood and blood-forming organs and certain disorders involving the immune mechanism: Secondary | ICD-10-CM | POA: Diagnosis not present

## 2017-12-05 DIAGNOSIS — R5383 Other fatigue: Secondary | ICD-10-CM

## 2017-12-05 DIAGNOSIS — I10 Essential (primary) hypertension: Secondary | ICD-10-CM | POA: Diagnosis not present

## 2017-12-05 DIAGNOSIS — E039 Hypothyroidism, unspecified: Secondary | ICD-10-CM | POA: Insufficient documentation

## 2017-12-05 NOTE — Assessment & Plan Note (Signed)
Pending lab evaluation and sleep study. Will follow

## 2017-12-05 NOTE — Assessment & Plan Note (Addendum)
Elevated today.she will keep log at home and let me know if not approaching goal. If not at goal, will increase Norvasc to 5mg 

## 2017-12-05 NOTE — Assessment & Plan Note (Signed)
Pending  Lab evaluation.

## 2017-12-05 NOTE — Patient Instructions (Addendum)
Watch blood pressure.   Goal < 130/80. If not at goal , please increase norvasc to 5mg  daily  Labs today  Today we discussed referrals, orders.   I have placed these orders in the system for you.  Please be sure to give us a call if you have not heard from our office regarding scheduling a test or regarding referral in a timely manner.  It is very important that you let me know as soon as possible.

## 2017-12-05 NOTE — Progress Notes (Signed)
Subjective:    Patient ID: Laurie Cameron, female    DOB: April 26, 1986, 31 y.o.   MRN: [id number]  CC: Laurie Cameron is a 32 y.o. female who presents today for follow up.   HPI: Complains of fatigue over the last 3 months, unchanged. Describes not feeling resting after a good nights sleep. Working out. No depression or anxiety. No trouble falling asleep  Her mother was recently diagnosed with antiphopholipid antibody syndrome and she would like testing . Her aunt has h/o of 2 blood clots. Her father h/o DVTs and CVA. She hasn't noticed easy bruising or prolonged bleeding. No h/o dvt. No use of OCP. No heavy menstrual periods or clots seen. Non smoker.   HTN- compliant with medication. No cp, sob, dizziness. At home 130/70s.      HISTORY:  Past Medical History:  Diagnosis Date  . Asthma    AS A CHILD- NO INHALERS  . Dizziness    happened when first diagnosed with hypertension  . Elevated blood pressure reading 08/30/2016  . Frequent headaches   . GERD (gastroesophageal reflux disease)    OCC-TUMS PRN  . Hypertension   . RUQ pain 08/30/2016   Past Surgical History:  Procedure Laterality Date  . CHOLECYSTECTOMY N/A 02/01/2017   Procedure: LAPAROSCOPIC CHOLECYSTECTOMY;  Surgeon: Ricarda Frame, MD;  Location: [facility] ORS;  Service: General;  Laterality: N/A;  . NO PAST SURGERIES     Family History  Problem Relation Age of Onset  . Arthritis Mother   . Hyperlipidemia Mother   . Hypertension Mother   . Arthritis Father   . Hyperlipidemia Father   . Heart disease Father   . Hypertension Father   . GER disease Father   . Alcohol abuse Paternal Uncle   . Cancer Paternal Uncle   . Arthritis Maternal Grandmother   . Hyperlipidemia Maternal Grandmother   . Heart disease Maternal Grandmother   . Hypertension Maternal Grandmother   . Diabetes Maternal Grandmother   . Breast cancer Maternal Grandmother 33  . Arthritis Maternal Grandfather   . Hyperlipidemia Maternal Grandfather   .  Alcohol abuse Paternal Grandmother   . Arthritis Paternal Grandmother   . Hyperlipidemia Paternal Grandmother   . Hypertension Paternal Grandmother   . Mental illness Paternal Grandmother   . Alcohol abuse Paternal Grandfather   . Arthritis Paternal Grandfather   . Hyperlipidemia Paternal Grandfather   . Hyperlipidemia Maternal Aunt   . Hypertension Maternal Aunt   . Diabetes Maternal Aunt   . Breast cancer Maternal Aunt 40  . Cancer Maternal Uncle   . Mental illness Paternal Aunt   . Colon cancer Neg Hx     Allergies: Patient has no known allergies. Current Outpatient Medications on File Prior to Visit  Medication Sig Dispense Refill  . amLODipine (NORVASC) 2.5 MG tablet TAKE 1 TABLET BY MOUTH  DAILY 90 tablet 2  . calcium carbonate (TUMS - DOSED IN MG ELEMENTAL CALCIUM) 500 MG chewable tablet Chew 1 tablet by mouth as needed for indigestion or heartburn.    Marland Kitchen ibuprofen (ADVIL,MOTRIN) 200 MG tablet Take 400 mg by mouth every 6 (six) hours as needed for mild pain.    . Multiple Vitamins-Minerals (EQ MULTIVITAMINS ADULT GUMMY PO) Take 2 tablets by mouth daily.     No current facility-administered medications on file prior to visit.     Social History   Tobacco Use  . Smoking status: Never Smoker  . Smokeless tobacco: Never Used  Substance Use Topics  .  Alcohol use: Yes    Comment: RARE  . Drug use: No    Review of Systems  Constitutional: Positive for fatigue. Negative for chills and fever.  Respiratory: Negative for cough.   Cardiovascular: Negative for chest pain and palpitations.  Gastrointestinal: Negative for nausea and vomiting.  Psychiatric/Behavioral: Negative for sleep disturbance. The patient is not nervous/anxious.       Objective:    BP (!) 162/100 (BP Location: Left Arm, Patient Position: Sitting, Cuff Size: Large)   Pulse (!) 105   Temp 98.5 F (36.9 C) (Oral)   Wt (!) 322 lb 6.4 oz (146.2 kg)   SpO2 99%   BMI 50.49 kg/m  BP Readings from Last 3  Encounters:  12/05/17 (!) 162/100  07/02/17 (!) 146/92  02/09/17 (!) 140/99   Wt Readings from Last 3 Encounters:  12/05/17 (!) 322 lb 6.4 oz (146.2 kg)  07/02/17 (!) 311 lb 12.8 oz (141.4 kg)  02/09/17 297 lb (134.7 kg)    Physical Exam  Constitutional: She appears well-developed and well-nourished.  Eyes: Conjunctivae are normal.  Cardiovascular: Normal rate, regular rhythm, normal heart sounds and normal pulses.  Pulmonary/Chest: Effort normal and breath sounds normal. She has no wheezes. She has no rhonchi. She has no rales.  Neurological: She is alert.  Skin: Skin is warm and dry.  Psychiatric: She has a normal mood and affect. Her speech is normal and behavior is normal. Thought content normal.  Vitals reviewed.      Assessment & Plan:   Problem List Items Addressed This Visit      Cardiovascular and Mediastinum   HTN (hypertension)    Elevated today.she will keep log at home and let me know if not approaching goal. If not at goal, will increase Norvasc to 5mg         Other   Family history of bleeding disorder    Pending  Lab evaluation.       Relevant Orders   CBC with Differential/Platelet   INR/PT   APTT   Von Willebrand panel   Antiphospholipid Syndrome Comp   Factor 5 leiden   Prothrombin Gene Mutation   Protein C activity   Protein C, total   Protein S activity   Protein S, total   Cardiolipin antibodies, IgG, IgM, IgA   IBC panel   D-Dimer, Quantitative   Antithrombin panel   Elevated TSH - Primary    Pending thyroid studies.       Relevant Orders   T3, free   T4, free   TSH   Thyrotropin receptor autoabs   Other fatigue    Pending lab evaluation and sleep study. Will follow      Relevant Orders   Ambulatory referral to Sleep Studies       I am having Ralene OkSarah Kanady maintain her Multiple Vitamins-Minerals (EQ MULTIVITAMINS ADULT GUMMY PO), ibuprofen, calcium carbonate, and amLODipine.   No orders of the defined types were placed  in this encounter.   Return precautions given.   Risks, benefits, and alternatives of the medications and treatment plan prescribed today were discussed, and patient expressed understanding.   Education regarding symptom management and diagnosis given to patient on AVS.  Continue to follow with Allegra GranaArnett, Donnette Macmullen G, FNP for routine health maintenance.   Ralene OkSarah Beal and I agreed with plan.   Rennie PlowmanMargaret Kalisa Girtman, FNP

## 2017-12-05 NOTE — Assessment & Plan Note (Signed)
Pending thyroid studies 

## 2017-12-06 ENCOUNTER — Encounter: Payer: Self-pay | Admitting: Family

## 2017-12-06 NOTE — Progress Notes (Signed)
Called patient states she didn't get a chance to take blood pressure before she went to work . She will call back with blood pressure reading this afternoon.

## 2017-12-07 ENCOUNTER — Encounter: Payer: Self-pay | Admitting: Family

## 2017-12-17 ENCOUNTER — Other Ambulatory Visit: Payer: Managed Care, Other (non HMO)

## 2017-12-18 LAB — VON WILLEBRAND PANEL
Factor VIII Activity: 140 % (ref 57–163)
Von Willebrand Ag: 147 % (ref 50–200)
Von Willebrand Factor: 109 % (ref 50–200)

## 2017-12-18 LAB — CBC WITH DIFFERENTIAL/PLATELET
Basophils Absolute: 0 10*3/uL (ref 0.0–0.2)
Basos: 0 %
EOS (ABSOLUTE): 0.2 10*3/uL (ref 0.0–0.4)
EOS: 2 %
HEMATOCRIT: 41.8 % (ref 34.0–46.6)
Hemoglobin: 14 g/dL (ref 11.1–15.9)
Immature Grans (Abs): 0 10*3/uL (ref 0.0–0.1)
Immature Granulocytes: 0 %
Lymphocytes Absolute: 2.7 10*3/uL (ref 0.7–3.1)
Lymphs: 29 %
MCH: 28.8 pg (ref 26.6–33.0)
MCHC: 33.5 g/dL (ref 31.5–35.7)
MCV: 86 fL (ref 79–97)
MONOS ABS: 0.5 10*3/uL (ref 0.1–0.9)
Monocytes: 6 %
Neutrophils Absolute: 5.6 10*3/uL (ref 1.4–7.0)
Neutrophils: 63 %
PLATELETS: 312 10*3/uL (ref 150–379)
RBC: 4.86 x10E6/uL (ref 3.77–5.28)
RDW: 12.9 % (ref 12.3–15.4)
WBC: 9.1 10*3/uL (ref 3.4–10.8)

## 2017-12-18 LAB — ANTIPHOSPHOLIPID SYNDROME COMP
ANTIPHOSPHATIDYLSERINE IGG: 4 {GPS'U}
ANTIPHOSPHATIDYLSERINE IGM: 1 {MPS'U}
APTT: 27.8 s
Anticardiolipin Ab, IgA: <10 [APL'U]
Anticardiolipin Ab, IgM: <10 [MPL'U]
Antiprothrombin Antibody, IgG: 7 G units
DRVVT Screen Seconds: 30.3 s
HEXAGONAL PHOSPHOLIPID NEUTRAL: 3 s
Platelet Neutralization: 0 s

## 2017-12-18 LAB — APTT: APTT: 29 s (ref 24–33)

## 2017-12-18 LAB — PROTIME-INR
INR: 1 (ref 0.8–1.2)
Prothrombin Time: 10.2 s (ref 9.1–12.0)

## 2017-12-18 LAB — PROTEIN C, TOTAL: Protein C Antigen: 129 % (ref 60–150)

## 2017-12-18 LAB — ANTITHROMBIN PANEL
ANTITHROMB III FUNC: 94 % (ref 75–135)
AT III AG PPP IMM-ACNC: 75 % (ref 72–124)

## 2017-12-18 LAB — CARDIOLIPIN ANTIBODIES, IGG, IGM, IGA: ANTICARDIOLIPIN IGM: 11 [MPL'U]/mL (ref 0–12)

## 2017-12-18 LAB — T3, FREE: T3 FREE: 3.1 pg/mL (ref 2.0–4.4)

## 2017-12-18 LAB — PROTEIN C ACTIVITY: Protein C Activity: 142 % (ref 73–180)

## 2017-12-18 LAB — T4, FREE: Free T4: 0.96 ng/dL (ref 0.82–1.77)

## 2017-12-18 LAB — TSH: TSH: 6.03 u[IU]/mL — ABNORMAL HIGH (ref 0.450–4.500)

## 2017-12-18 LAB — COAG STUDIES INTERP REPORT

## 2017-12-18 LAB — THYROTROPIN RECEPTOR AUTOABS: Thyrotropin Receptor Ab: 0.52 IU/L (ref 0.00–1.75)

## 2017-12-18 LAB — FACTOR 5 LEIDEN

## 2017-12-18 LAB — PROTEIN S ACTIVITY: Protein S Activity: 85 % (ref 63–140)

## 2017-12-18 LAB — D-DIMER, QUANTITATIVE: D-DIMER: 0.34 mg/L FEU (ref 0.00–0.49)

## 2017-12-18 LAB — PROTHROMBIN GENE MUTATION

## 2017-12-18 LAB — PROTEIN S, TOTAL: Protein S Ag, Total: 99 % (ref 60–150)

## 2017-12-24 ENCOUNTER — Encounter: Payer: Self-pay | Admitting: Family

## 2017-12-24 ENCOUNTER — Other Ambulatory Visit: Payer: Self-pay | Admitting: Family

## 2017-12-24 DIAGNOSIS — R7989 Other specified abnormal findings of blood chemistry: Secondary | ICD-10-CM

## 2017-12-24 NOTE — Progress Notes (Signed)
referall to endocrine

## 2017-12-26 ENCOUNTER — Other Ambulatory Visit: Payer: Self-pay | Admitting: Family

## 2017-12-26 NOTE — Progress Notes (Signed)
close

## 2018-01-25 ENCOUNTER — Encounter: Payer: Self-pay | Admitting: Family

## 2018-01-28 ENCOUNTER — Other Ambulatory Visit: Payer: Self-pay | Admitting: Family

## 2018-01-28 DIAGNOSIS — R030 Elevated blood-pressure reading, without diagnosis of hypertension: Secondary | ICD-10-CM

## 2018-01-28 MED ORDER — AMLODIPINE BESYLATE 5 MG PO TABS: 5.0000 mg | ORAL_TABLET | Freq: Every day | ORAL | 1 refills | Status: DC

## 2018-01-28 NOTE — Progress Notes (Signed)
close

## 2018-07-01 ENCOUNTER — Other Ambulatory Visit: Payer: Self-pay | Admitting: Family

## 2018-07-01 DIAGNOSIS — R030 Elevated blood-pressure reading, without diagnosis of hypertension: Secondary | ICD-10-CM

## 2018-09-06 ENCOUNTER — Encounter: Payer: Managed Care, Other (non HMO) | Admitting: Family

## 2018-09-13 ENCOUNTER — Ambulatory Visit (INDEPENDENT_AMBULATORY_CARE_PROVIDER_SITE_OTHER): Payer: Managed Care, Other (non HMO) | Admitting: Family

## 2018-09-13 ENCOUNTER — Encounter: Payer: Self-pay | Admitting: Family

## 2018-09-13 ENCOUNTER — Other Ambulatory Visit: Payer: Self-pay | Admitting: Family

## 2018-09-13 VITALS — BP 140/90 | HR 107 | Temp 98.5°F | Ht 69.0 in | Wt 323.8 lb

## 2018-09-13 DIAGNOSIS — R2231 Localized swelling, mass and lump, right upper limb: Secondary | ICD-10-CM

## 2018-09-13 DIAGNOSIS — Z0001 Encounter for general adult medical examination with abnormal findings: Secondary | ICD-10-CM

## 2018-09-13 DIAGNOSIS — L739 Follicular disorder, unspecified: Secondary | ICD-10-CM

## 2018-09-13 DIAGNOSIS — I10 Essential (primary) hypertension: Secondary | ICD-10-CM

## 2018-09-13 DIAGNOSIS — Z23 Encounter for immunization: Secondary | ICD-10-CM | POA: Diagnosis not present

## 2018-09-13 DIAGNOSIS — Z0000 Encounter for general adult medical examination without abnormal findings: Secondary | ICD-10-CM

## 2018-09-13 MED ORDER — MUPIROCIN CALCIUM 2 % EX CREA: 1.0000 "application " | TOPICAL_CREAM | Freq: Three times a day (TID) | CUTANEOUS | 2 refills | Status: DC

## 2018-09-13 MED ORDER — HYDROCHLOROTHIAZIDE 12.5 MG PO TABS: 12.5000 mg | ORAL_TABLET | Freq: Every day | ORAL | 3 refills | Status: DC

## 2018-09-13 NOTE — Assessment & Plan Note (Signed)
Elevated . Start hctz in addition to amlodipine. Labs in one week. Patient will monitor at home

## 2018-09-13 NOTE — Addendum Note (Signed)
Addended by: Davis GourdERRELL, Kaytelyn E on: 09/13/2018 05:37 PM   Modules accepted: Orders

## 2018-09-13 NOTE — Assessment & Plan Note (Signed)
Clinical breast exam performed.  Deferred pelvic exam in the absence of complaints and also Pap smear is up-to-date.

## 2018-09-13 NOTE — Assessment & Plan Note (Signed)
Possible ganglion cyst. Referral to orthopedic.

## 2018-09-13 NOTE — Assessment & Plan Note (Addendum)
Local Folliculitis of the right breast.  Bactroban given.  Education advised on Dial soap, warm compresses.

## 2018-09-13 NOTE — Patient Instructions (Addendum)
As needed Bactroban for folliculitis.  Use of Dial soap.    Start HCTZ Repeat labs in one week, fasting   Monitor blood pressure,  Goal is less than 120/80, based on newest guidelines; if persistently higher, please make sooner follow up appointment so we can recheck you blood pressure and manage medications  Today we discussed referrals, orders. orthopedic   I have placed these orders in the system for you.  Please be sure to give [place] a call if you have not heard from our office regarding this. We should hear from [place] within ONE week with information regarding your appointment. If not, please let me know immediately.   Health Maintenance, Female Adopting a healthy lifestyle and getting preventive care can go a long way to promote health and wellness. Talk with your health care provider about what schedule of regular examinations is right for you. This is a good chance for you to check in with your provider about disease prevention and staying healthy. In between checkups, there are plenty of things you can do on your own. Experts have done a lot of research about which lifestyle changes and preventive measures are most likely to keep you healthy. Ask your health care provider for more information. Weight and diet Eat a healthy diet  Be sure to include plenty of vegetables, fruits, low-fat dairy products, and lean protein.  Do not eat a lot of foods high in solid fats, added sugars, or salt.  Get regular exercise. This is one of the most important things you can do for your health. ? Most adults should exercise for at least 150 minutes each week. The exercise should increase your heart rate and make you sweat (moderate-intensity exercise). ? Most adults should also do strengthening exercises at least twice a week. This is in addition to the moderate-intensity exercise. Maintain a healthy weight  Body mass index (BMI) is a measurement that can be used to identify possible weight problems. It  estimates body fat based on height and weight. Your health care provider can help determine your BMI and help you achieve or maintain a healthy weight.  For females 33 years of age and older: ? A BMI below 18.5 is considered underweight. ? A BMI of 18.5 to 24.9 is normal. ? A BMI of 25 to 29.9 is considered overweight. ? A BMI of 30 and above is considered obese. Watch levels of cholesterol and blood lipids  You should start having your blood tested for lipids and cholesterol at 33 years of age, then have this test every 5 years.  You may need to have your cholesterol levels checked more often if: ? Your lipid or cholesterol levels are high. ? You are older than 33 years of age. ? You are at high risk for heart disease. Cancer screening Lung Cancer  Lung cancer screening is recommended for adults 33-9 years old who are at high risk for lung cancer because of a history of smoking.  A yearly low-dose CT scan of the lungs is recommended for people who: ? Currently smoke. ? Have quit within the past 15 years. ? Have at least a 30-pack-year history of smoking. A pack year is smoking an average of one pack of cigarettes a day for 1 year.  Yearly screening should continue until it has been 15 years since you quit.  Yearly screening should stop if you develop a health problem that would prevent you from having lung cancer treatment. Breast Cancer  Practice breast  self-awareness. This means understanding how your breasts normally appear and feel.  It also means doing regular breast self-exams. Let your health care provider know about any changes, no matter how small.  If you are in your 20s or 30s, you should have a clinical breast exam (CBE) by a health care provider every 1-3 years as part of a regular health exam.  If you are 62 or older, have a CBE every year. Also consider having a breast X-ray (mammogram) every year.  If you have a family history of breast cancer, talk to your  health care provider about genetic screening.  If you are at high risk for breast cancer, talk to your health care provider about having an MRI and a mammogram every year.  Breast cancer gene (BRCA) assessment is recommended for women who have family members with BRCA-related cancers. BRCA-related cancers include: ? Breast. ? Ovarian. ? Tubal. ? Peritoneal cancers.  Results of the assessment will determine the need for genetic counseling and BRCA1 and BRCA2 testing. Cervical Cancer Your health care provider may recommend that you be screened regularly for cancer of the pelvic organs (ovaries, uterus, and vagina). This screening involves a pelvic examination, including checking for microscopic changes to the surface of your cervix (Pap test). You may be encouraged to have this screening done every 3 years, beginning at age 21.  For women ages 33-65, health care providers may recommend pelvic exams and Pap testing every 3 years, or they may recommend the Pap and pelvic exam, combined with testing for human papilloma virus (HPV), every 5 years. Some types of HPV increase your risk of cervical cancer. Testing for HPV may also be done on women of any age with unclear Pap test results.  Other health care providers may not recommend any screening for nonpregnant women who are considered low risk for pelvic cancer and who do not have symptoms. Ask your health care provider if a screening pelvic exam is right for you.  If you have had past treatment for cervical cancer or a condition that could lead to cancer, you need Pap tests and screening for cancer for at least 20 years after your treatment. If Pap tests have been discontinued, your risk factors (such as having a new sexual partner) need to be reassessed to determine if screening should resume. Some women have medical problems that increase the chance of getting cervical cancer. In these cases, your health care provider may recommend more frequent  screening and Pap tests. Colorectal Cancer  This type of cancer can be detected and often prevented.  Routine colorectal cancer screening usually begins at 33 years of age and continues through 33 years of age.  Your health care provider may recommend screening at an earlier age if you have risk factors for colon cancer.  Your health care provider may also recommend using home test kits to check for hidden blood in the stool.  A small camera at the end of a tube can be used to examine your colon directly (sigmoidoscopy or colonoscopy). This is done to check for the earliest forms of colorectal cancer.  Routine screening usually begins at age 37.  Direct examination of the colon should be repeated every 5-10 years through 33 years of age. However, you may need to be screened more often if early forms of precancerous polyps or small growths are found. Skin Cancer  Check your skin from head to toe regularly.  Tell your health care provider about any new moles  or changes in moles, especially if there is a change in a mole's shape or color.  Also tell your health care provider if you have a mole that is larger than the size of a pencil eraser.  Always use sunscreen. Apply sunscreen liberally and repeatedly throughout the day.  Protect yourself by wearing long sleeves, pants, a wide-brimmed hat, and sunglasses whenever you are outside. Heart disease, diabetes, and high blood pressure  High blood pressure causes heart disease and increases the risk of stroke. High blood pressure is more likely to develop in: ? People who have blood pressure in the high end of the normal range (130-139/85-89 mm Hg). ? People who are overweight or obese. ? People who are African American.  If you are 62-1 years of age, have your blood pressure checked every 3-5 years. If you are 24 years of age or older, have your blood pressure checked every year. You should have your blood pressure measured twice-once  when you are at a hospital or clinic, and once when you are not at a hospital or clinic. Record the average of the two measurements. To check your blood pressure when you are not at a hospital or clinic, you can use: ? An automated blood pressure machine at a pharmacy. ? A home blood pressure monitor.  If you are between 51 years and 60 years old, ask your health care provider if you should take aspirin to prevent strokes.  Have regular diabetes screenings. This involves taking a blood sample to check your fasting blood sugar level. ? If you are at a normal weight and have a low risk for diabetes, have this test once every three years after 33 years of age. ? If you are overweight and have a high risk for diabetes, consider being tested at a younger age or more often. Preventing infection Hepatitis B  If you have a higher risk for hepatitis B, you should be screened for this virus. You are considered at high risk for hepatitis B if: ? You were born in a country where hepatitis B is common. Ask your health care provider which countries are considered high risk. ? Your parents were born in a high-risk country, and you have not been immunized against hepatitis B (hepatitis B vaccine). ? You have HIV or AIDS. ? You use needles to inject street drugs. ? You live with someone who has hepatitis B. ? You have had sex with someone who has hepatitis B. ? You get hemodialysis treatment. ? You take certain medicines for conditions, including cancer, organ transplantation, and autoimmune conditions. Hepatitis C  Blood testing is recommended for: ? Everyone born from [birth year] through 1966. ? Anyone with known risk factors for hepatitis C. Sexually transmitted infections (STIs)  You should be screened for sexually transmitted infections (STIs) including gonorrhea and chlamydia if: ? You are sexually active and are younger than 33 years of age. ? You are older than 33 years of age and your health care  provider tells you that you are at risk for this type of infection. ? Your sexual activity has changed since you were last screened and you are at an increased risk for chlamydia or gonorrhea. Ask your health care provider if you are at risk.  If you do not have HIV, but are at risk, it may be recommended that you take a prescription medicine daily to prevent HIV infection. This is called pre-exposure prophylaxis (PrEP). You are considered at risk if: ? You are  sexually active and do not regularly use condoms or know the HIV status of your partner(s). ? You take drugs by injection. ? You are sexually active with a partner who has HIV. Talk with your health care provider about whether you are at high risk of being infected with HIV. If you choose to begin PrEP, you should first be tested for HIV. You should then be tested every 3 months for as long as you are taking PrEP. Pregnancy  If you are premenopausal and you may become pregnant, ask your health care provider about preconception counseling.  If you may become pregnant, take 400 to 800 micrograms (mcg) of folic acid every day.  If you want to prevent pregnancy, talk to your health care provider about birth control (contraception). Osteoporosis and menopause  Osteoporosis is a disease in which the bones lose minerals and strength with aging. This can result in serious bone fractures. Your risk for osteoporosis can be identified using a bone density scan.  If you are 88 years of age or older, or if you are at risk for osteoporosis and fractures, ask your health care provider if you should be screened.  Ask your health care provider whether you should take a calcium or vitamin D supplement to lower your risk for osteoporosis.  Menopause may have certain physical symptoms and risks.  Hormone replacement therapy may reduce some of these symptoms and risks. Talk to your health care provider about whether hormone replacement therapy is right  for you. Follow these instructions at home:  Schedule regular health, dental, and eye exams.  Stay current with your immunizations.  Do not use any tobacco products including cigarettes, chewing tobacco, or electronic cigarettes.  If you are pregnant, do not drink alcohol.  If you are breastfeeding, limit how much and how often you drink alcohol.  Limit alcohol intake to no more than 1 drink per day for nonpregnant women. One drink equals 12 ounces of beer, 5 ounces of wine, or 1 ounces of hard liquor.  Do not use street drugs.  Do not share needles.  Ask your health care provider for help if you need support or information about quitting drugs.  Tell your health care provider if you often feel depressed.  Tell your health care provider if you have ever been abused or do not feel safe at home. This information is not intended to replace advice given to you by your health care provider. Make sure you discuss any questions you have with your health care provider. Document Released: 03/27/2011 Document Revised: 02/17/2016 Document Reviewed: 06/15/2015 Elsevier Interactive Patient Education  2019 [employer].

## 2018-09-13 NOTE — Progress Notes (Signed)
Subjective:    Patient ID: Laurie Cameron, female    DOB: July 17, 1986, 32 y.o.   MRN: [id number]  CC: Laurie Cameron is a 32 y.o. female who presents today for physical exam.    HPI: Complains of Left hand nodule under palm, for past month which has grown in size. 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  No numbness, purulent discharge.  Able to move fingers normally.  Small red bump on right breast. Slightly tender.  No purulent discharge.  History of abscess.  HTN- at home 135/80.  Compliant medication.  No chest pain, shortness of breath.   Continues to follow with endocrine for hypothyroidism.  No depression, anxiety.   Colorectal Cancer Screening: no family history Breast Cancer Screening: Maternal aunt , MGF; Declines genetic consult.  Cervical Cancer Screening: UTD         Tetanus - due         Labs: Screening labs today. Exercise: Gets regular exercise.  Alcohol use: rare Smoking/tobacco use: Nonsmoker.  Regular dental exams: UTD Wears seat belt: Yes. Skin: no new lesions. Declines dermatology referral and states she doesn't need a referral.   HISTORY:  Past Medical History:  Diagnosis Date  . Asthma    AS A CHILD- NO INHALERS  . Dizziness    happened when first diagnosed with hypertension  . Elevated blood pressure reading 08/30/2016  . Frequent headaches   . GERD (gastroesophageal reflux disease)    OCC-TUMS PRN  . Hypertension   . RUQ pain 08/30/2016    Past Surgical History:  Procedure Laterality Date  . CHOLECYSTECTOMY N/A 02/01/2017   Procedure: LAPAROSCOPIC CHOLECYSTECTOMY;  Surgeon: Ricarda FrameWoodham, Charles, MD;  Location: [facility] ORS;  Service: General;  Laterality: N/A;  . NO PAST SURGERIES     Family History  Problem Relation Age of Onset  . Arthritis Mother   . Hyperlipidemia Mother   . Hypertension Mother   . Arthritis Father   . Hyperlipidemia Father   . Heart disease Father   . Hypertension Father   . GER disease Father   . Alcohol abuse Paternal  Uncle   . Cancer Paternal Uncle   . Arthritis Maternal Grandmother   . Hyperlipidemia Maternal Grandmother   . Heart disease Maternal Grandmother   . Hypertension Maternal Grandmother   . Diabetes Maternal Grandmother   . Breast cancer Maternal Grandmother [id number]  . Arthritis Maternal Grandfather   . Hyperlipidemia Maternal Grandfather   . Alcohol abuse Paternal Grandmother   . Arthritis Paternal Grandmother   . Hyperlipidemia Paternal Grandmother   . Hypertension Paternal Grandmother   . Mental illness Paternal Grandmother   . Alcohol abuse Paternal Grandfather   . Arthritis Paternal Grandfather   . Hyperlipidemia Paternal Grandfather   . Hyperlipidemia Maternal Aunt   . Hypertension Maternal Aunt   . Diabetes Maternal Aunt   . Breast cancer Maternal Aunt 40  . Cancer Maternal Uncle   . Mental illness Paternal Aunt   . Colon cancer Neg Hx       ALLERGIES: Patient has no known allergies.  Current Outpatient Medications on File Prior to Visit  Medication Sig Dispense Refill  . amLODipine (NORVASC) 5 MG tablet TAKE 1 TABLET(5 MG) BY MOUTH DAILY 90 tablet 0  . calcium carbonate (TUMS - DOSED IN MG ELEMENTAL CALCIUM) 500 MG chewable tablet Chew 1 tablet by mouth as needed for indigestion or heartburn.    Marland Kitchen. ibuprofen (ADVIL,MOTRIN) 200 MG tablet Take 400 mg by mouth every  6 (six) hours as needed for mild pain.    Marland Kitchen. levothyroxine (SYNTHROID, LEVOTHROID) 25 MCG tablet Take 25 mcg by mouth daily before breakfast.    . Multiple Vitamins-Minerals (EQ MULTIVITAMINS ADULT GUMMY PO) Take 2 tablets by mouth daily.     No current facility-administered medications on file prior to visit.     Social History   Tobacco Use  . Smoking status: Never Smoker  . Smokeless tobacco: Never Used  Substance Use Topics  . Alcohol use: Yes    Comment: RARE  . Drug use: No    Review of Systems  Constitutional: Negative for chills, fever and unexpected weight change.  HENT: Negative for congestion.    Respiratory: Negative for cough.   Cardiovascular: Negative for chest pain, palpitations and leg swelling.  Gastrointestinal: Negative for nausea and vomiting.  Genitourinary: Negative for vaginal pain.  Musculoskeletal: Negative for arthralgias, joint swelling and myalgias.  Skin: Positive for color change (lesion right breast). Negative for rash.  Neurological: Negative for numbness and headaches.  Hematological: Negative for adenopathy.  Psychiatric/Behavioral: Negative for confusion.      Objective:    BP 140/90   Pulse (!) 107   Temp 98.5 F (36.9 C)   Ht 5\' 9"  (1.753 m)   Wt (!) 323 lb 12.8 oz (146.9 kg)   SpO2 98%   BMI 47.82 kg/m   BP Readings from Last 3 Encounters:  09/13/18 140/90  12/05/17 (!) 162/100  07/02/17 (!) 146/92   Wt Readings from Last 3 Encounters:  09/13/18 (!) 323 lb 12.8 oz (146.9 kg)  12/05/17 (!) 322 lb 6.4 oz (146.2 kg)  07/02/17 (!) 311 lb 12.8 oz (141.4 kg)    Physical Exam Vitals signs reviewed.  Constitutional:      Appearance: She is well-developed.  Eyes:     Conjunctiva/sclera: Conjunctivae normal.  Neck:     Thyroid: No thyroid mass or thyromegaly.  Cardiovascular:     Rate and Rhythm: Normal rate and regular rhythm.     Pulses: Normal pulses.     Heart sounds: Normal heart sounds.  Pulmonary:     Effort: Pulmonary effort is normal.     Breath sounds: Normal breath sounds. No wheezing, rhonchi or rales.  Chest:     Breasts: Breasts are symmetrical.        Right: No inverted nipple, mass, nipple discharge, skin change or tenderness.        Left: No inverted nipple, mass, nipple discharge, skin change or tenderness.  Musculoskeletal:       Hands:     Comments: Cyst noted left ventral side of hand, proximal to 2nd metacarpal.  Nonfluctuant; no increased warmth, erythema.    Lymphadenopathy:     Head:     Right side of head: No submental, submandibular, tonsillar, preauricular, posterior auricular or occipital adenopathy.       Left side of head: No submental, submandibular, tonsillar, preauricular, posterior auricular or occipital adenopathy.     Cervical: No cervical adenopathy.     Right cervical: No superficial, deep or posterior cervical adenopathy.    Left cervical: No superficial, deep or posterior cervical adenopathy.  Skin:    General: Skin is warm and dry.     Findings: Rash present. Rash is papular.          Comments: Erythematous papule noted right breast as noted on diagram.  Nonfluctuant.  No purulent discharge.  Neurological:     Mental Status: She is alert.  Psychiatric:        Speech: Speech normal.        Behavior: Behavior normal.        Thought Content: Thought content normal.        Assessment & Plan:   Problem List Items Addressed This Visit      Cardiovascular and Mediastinum   HTN (hypertension)    Elevated . Start hctz in addition to amlodipine. Labs in one week. Patient will monitor at home      Relevant Medications   hydrochlorothiazide (HYDRODIURIL) 12.5 MG tablet     Musculoskeletal and Integument   Folliculitis    Local Folliculitis of the right breast.  Bactroban given.  Education advised on Dial soap, warm compresses.      Relevant Medications   mupirocin cream (BACTROBAN) 2 %     Other   Routine physical examination - Primary    Clinical breast exam performed.  Deferred pelvic exam in the absence of complaints and also Pap smear is up-to-date.      Nodule of skin of right hand    Possible ganglion cyst. Referral to orthopedic.       Relevant Orders   Ambulatory referral to Orthopedic Surgery    Other Visit Diagnoses    Need for immunization against influenza       Relevant Orders   Flu Vaccine QUAD 36+ mos IM (Completed)       I am having Laurie Cameron start on hydrochlorothiazide and mupirocin cream. I am also having her maintain her Multiple Vitamins-Minerals (EQ MULTIVITAMINS ADULT GUMMY PO), ibuprofen, calcium carbonate, amLODipine, and  levothyroxine.   Meds ordered this encounter  Medications  . hydrochlorothiazide (HYDRODIURIL) 12.5 MG tablet    Sig: Take 1 tablet (12.5 mg total) by mouth daily.    Dispense:  90 tablet    Refill:  3    Order Specific Question:   Supervising Provider    Answer:   Duncan Dull L [[id number]]  . mupirocin cream (BACTROBAN) 2 %    Sig: Apply 1 application topically 3 (three) times daily.    Dispense:  15 g    Refill:  2    Order Specific Question:   Supervising Provider    Answer:   Sherlene Shams [[id number]]    Return precautions given.   Risks, benefits, and alternatives of the medications and treatment plan prescribed today were discussed, and patient expressed understanding.   Education regarding symptom management and diagnosis given to patient on AVS.   Continue to follow with Allegra Grana, FNP for routine health maintenance.   Laurie Cameron and I agreed with plan.   Laurie Plowman, FNP

## 2018-09-24 ENCOUNTER — Telehealth: Payer: Self-pay

## 2018-09-24 NOTE — Telephone Encounter (Signed)
Copied from CRM [phone]. Topic: General - Other >> Sep 24, 2018  9:58 AM Fanny BienIlderton, Jessica L wrote: Reason for CRM: pt called and stated that she would like her lab orders faxed to [facility] [address] location in [place] Fax#[phone]   **I have faxed to [facility]**

## 2018-09-27 ENCOUNTER — Other Ambulatory Visit: Payer: Self-pay | Admitting: Family

## 2018-09-27 DIAGNOSIS — R030 Elevated blood-pressure reading, without diagnosis of hypertension: Secondary | ICD-10-CM

## 2018-09-28 LAB — HEMOGLOBIN A1C
Est. average glucose Bld gHb Est-mCnc: 154 mg/dL
Hgb A1c MFr Bld: 7 % — ABNORMAL HIGH (ref 4.8–5.6)

## 2018-09-28 LAB — CBC WITH DIFFERENTIAL/PLATELET
Basophils Absolute: 0 10*3/uL (ref 0.0–0.2)
Basos: 0 %
EOS (ABSOLUTE): 0.2 10*3/uL (ref 0.0–0.4)
Eos: 3 %
Hematocrit: 40.3 % (ref 34.0–46.6)
Hemoglobin: 13.3 g/dL (ref 11.1–15.9)
IMMATURE GRANS (ABS): 0 10*3/uL (ref 0.0–0.1)
Immature Granulocytes: 0 %
Lymphocytes Absolute: 2.6 10*3/uL (ref 0.7–3.1)
Lymphs: 29 %
MCH: 28.1 pg (ref 26.6–33.0)
MCHC: 33 g/dL (ref 31.5–35.7)
MCV: 85 fL (ref 79–97)
Monocytes Absolute: 0.6 10*3/uL (ref 0.1–0.9)
Monocytes: 7 %
NEUTROS ABS: 5.5 10*3/uL (ref 1.4–7.0)
Neutrophils: 61 %
Platelets: 304 10*3/uL (ref 150–450)
RBC: 4.73 x10E6/uL (ref 3.77–5.28)
RDW: 12.3 % (ref 12.3–15.4)
WBC: 8.9 10*3/uL (ref 3.4–10.8)

## 2018-09-28 LAB — LIPID PANEL
Chol/HDL Ratio: 4.2 ratio (ref 0.0–4.4)
Cholesterol, Total: 218 mg/dL — ABNORMAL HIGH (ref 100–199)
HDL: 52 mg/dL (ref >39)
LDL Calculated: 132 mg/dL — ABNORMAL HIGH (ref 0–99)
Triglycerides: 172 mg/dL — ABNORMAL HIGH (ref 0–149)
VLDL Cholesterol Cal: 34 mg/dL (ref 5–40)

## 2018-09-28 LAB — VITAMIN D 25 HYDROXY (VIT D DEFICIENCY, FRACTURES): Vit D, 25-Hydroxy: 17.6 ng/mL — ABNORMAL LOW (ref 30.0–100.0)

## 2018-10-02 ENCOUNTER — Other Ambulatory Visit: Payer: Self-pay

## 2018-10-02 DIAGNOSIS — R030 Elevated blood-pressure reading, without diagnosis of hypertension: Secondary | ICD-10-CM

## 2018-10-02 MED ORDER — AMLODIPINE BESYLATE 5 MG PO TABS: ORAL_TABLET | ORAL | 0 refills | Status: DC

## 2018-10-09 ENCOUNTER — Encounter: Payer: Self-pay | Admitting: Family

## 2018-10-09 DIAGNOSIS — E119 Type 2 diabetes mellitus without complications: Secondary | ICD-10-CM | POA: Insufficient documentation

## 2018-10-12 ENCOUNTER — Encounter: Payer: Self-pay | Admitting: Family

## 2018-10-14 ENCOUNTER — Other Ambulatory Visit: Payer: Self-pay | Admitting: Family

## 2018-10-14 DIAGNOSIS — E119 Type 2 diabetes mellitus without complications: Secondary | ICD-10-CM

## 2018-10-14 MED ORDER — METFORMIN HCL ER 500 MG PO TB24: ORAL_TABLET | ORAL | 3 refills | Status: DC

## 2018-10-18 DIAGNOSIS — Z6841 Body Mass Index (BMI) 40.0 and over, adult: Secondary | ICD-10-CM | POA: Insufficient documentation

## 2018-12-20 ENCOUNTER — Ambulatory Visit (INDEPENDENT_AMBULATORY_CARE_PROVIDER_SITE_OTHER): Payer: Managed Care, Other (non HMO) | Admitting: Family

## 2018-12-20 DIAGNOSIS — E119 Type 2 diabetes mellitus without complications: Secondary | ICD-10-CM

## 2018-12-20 DIAGNOSIS — I10 Essential (primary) hypertension: Secondary | ICD-10-CM | POA: Diagnosis not present

## 2018-12-20 NOTE — Progress Notes (Signed)
This visit type was conducted due to national recommendations for restrictions regarding the COVID-19 pandemic (e.g. social distancing).  This format is felt to be most appropriate for this patient at this time.  All issues noted in this document were discussed and addressed.  No physical exam was performed (except for noted visual exam findings with Video Visits).  Virtual Visit via Video Note  I connected with@ on 12/23/18 at  4:00 PM EDT by a video enabled telemedicine application and verified that I am speaking with the correct person using two identifiers.  Location patient: home Location provider:work  Persons participating in the virtual visit: patient, provider  I discussed the limitations of evaluation and management by telemedicine and the availability of in person appointments. The patient expressed understanding and agreed to proceed.   HPI: Feels well today. No new complaints.  HTN- at home 122/77. Denies exertional chest pain or pressure, numbness or tingling radiating to left arm or jaw, palpitations, dizziness, frequent headaches, changes in vision, or shortness of breath.   DM- newly dx; started metformin. Tolerating well. Will do a1c, lipids at [facility].  ROS: See pertinent positives and negatives per HPI.  Past Medical History:  Diagnosis Date  . Asthma    AS A CHILD- NO INHALERS  . Dizziness    happened when first diagnosed with hypertension  . Elevated blood pressure reading 08/30/2016  . Frequent headaches   . GERD (gastroesophageal reflux disease)    OCC-TUMS PRN  . Hypertension   . RUQ pain 08/30/2016    Past Surgical History:  Procedure Laterality Date  . CHOLECYSTECTOMY N/A 02/01/2017   Procedure: LAPAROSCOPIC CHOLECYSTECTOMY;  Surgeon: Ricarda Frame, MD;  Location: [facility] ORS;  Service: General;  Laterality: N/A;  . NO PAST SURGERIES      Family History  Problem Relation Age of Onset  . Arthritis Mother   . Hyperlipidemia Mother   . Hypertension  Mother   . Arthritis Father   . Hyperlipidemia Father   . Heart disease Father   . Hypertension Father   . GER disease Father   . Alcohol abuse Paternal Uncle   . Cancer Paternal Uncle   . Arthritis Maternal Grandmother   . Hyperlipidemia Maternal Grandmother   . Heart disease Maternal Grandmother   . Hypertension Maternal Grandmother   . Diabetes Maternal Grandmother   . Breast cancer Maternal Grandmother 28  . Arthritis Maternal Grandfather   . Hyperlipidemia Maternal Grandfather   . Alcohol abuse Paternal Grandmother   . Arthritis Paternal Grandmother   . Hyperlipidemia Paternal Grandmother   . Hypertension Paternal Grandmother   . Mental illness Paternal Grandmother   . Alcohol abuse Paternal Grandfather   . Arthritis Paternal Grandfather   . Hyperlipidemia Paternal Grandfather   . Hyperlipidemia Maternal Aunt   . Hypertension Maternal Aunt   . Diabetes Maternal Aunt   . Breast cancer Maternal Aunt 40  . Cancer Maternal Uncle   . Mental illness Paternal Aunt   . Colon cancer Neg Hx     SOCIAL HX:non smoker.    Current Outpatient Medications:  .  amLODipine (NORVASC) 5 MG tablet, TAKE 1 TABLET(5 MG) BY MOUTH DAILY, Disp: 90 tablet, Rfl: 0 .  calcium carbonate (TUMS - DOSED IN MG ELEMENTAL CALCIUM) 500 MG chewable tablet, Chew 1 tablet by mouth as needed for indigestion or heartburn., Disp: , Rfl:  .  hydrochlorothiazide (HYDRODIURIL) 12.5 MG tablet, Take 1 tablet (12.5 mg total) by mouth daily., Disp: 90 tablet, Rfl: 3 .  ibuprofen (ADVIL,MOTRIN) 200 MG tablet, Take 400 mg by mouth every 6 (six) hours as needed for mild pain., Disp: , Rfl:  .  levothyroxine (SYNTHROID, LEVOTHROID) 25 MCG tablet, Take 25 mcg by mouth daily before breakfast., Disp: , Rfl:  .  metFORMIN (GLUCOPHAGE XR) 500 MG 24 hr tablet, Start 500mg  PO qpm., Disp: 90 tablet, Rfl: 3 .  Multiple Vitamins-Minerals (EQ MULTIVITAMINS ADULT GUMMY PO), Take 2 tablets by mouth daily., Disp: , Rfl:  .  mupirocin  cream (BACTROBAN) 2 %, Apply 1 application topically 3 (three) times daily., Disp: 15 g, Rfl: 2  EXAM:  VITALS per patient if applicable:  GENERAL: alert, oriented, appears well and in no acute distress  HEENT: atraumatic, conjunttiva clear, no obvious abnormalities on inspection of external nose and ears  NECK: normal movements of the head and neck  LUNGS: on inspection no signs of respiratory distress, breathing rate appears normal, no obvious gross SOB, gasping or wheezing  CV: no obvious cyanosis  MS: moves all visible extremities without noticeable abnormality  PSYCH/NEURO: pleasant and cooperative, no obvious depression or anxiety, speech and thought processing grossly intact  ASSESSMENT AND PLAN:  Discussed the following assessment and plan:  Diabetes mellitus without complication (HCC) - Plan: Lipid panel, Hemoglobin A1c, VITAMIN D 25 Hydroxy (Vit-D Deficiency, Fractures), Microalbumin / creatinine urine ratio  Hypertension, unspecified type     I discussed the assessment and treatment plan with the patient. The patient was provided an opportunity to ask questions and all were answered. The patient agreed with the plan and demonstrated an understanding of the instructions.   The patient was advised to call back or seek an in-person evaluation if the symptoms worsen or if the condition fails to improve as anticipated.    [name], FNP

## 2018-12-23 ENCOUNTER — Encounter: Payer: Self-pay | Admitting: Family

## 2018-12-23 NOTE — Assessment & Plan Note (Signed)
Doing well metformin, pending A1c.

## 2018-12-23 NOTE — Assessment & Plan Note (Signed)
Well-controlled, continue current regimen 

## 2018-12-28 LAB — LIPID PANEL
Chol/HDL Ratio: 4 ratio (ref 0.0–4.4)
Cholesterol, Total: 211 mg/dL — ABNORMAL HIGH (ref 100–199)
HDL: 53 mg/dL (ref >39)
LDL Calculated: 115 mg/dL — ABNORMAL HIGH (ref 0–99)
Triglycerides: 215 mg/dL — ABNORMAL HIGH (ref 0–149)
VLDL Cholesterol Cal: 43 mg/dL — ABNORMAL HIGH (ref 5–40)

## 2018-12-28 LAB — MICROALBUMIN / CREATININE URINE RATIO
Creatinine, Urine: 124 mg/dL
Microalb/Creat Ratio: 101 mg/g creat — ABNORMAL HIGH (ref 0–29)
Microalbumin, Urine: 125.7 ug/mL

## 2018-12-28 LAB — VITAMIN D 25 HYDROXY (VIT D DEFICIENCY, FRACTURES): Vit D, 25-Hydroxy: 35.1 ng/mL (ref 30.0–100.0)

## 2018-12-28 LAB — HEMOGLOBIN A1C
Est. average glucose Bld gHb Est-mCnc: 151 mg/dL
Hgb A1c MFr Bld: 6.9 % — ABNORMAL HIGH (ref 4.8–5.6)

## 2019-01-13 ENCOUNTER — Other Ambulatory Visit: Payer: Self-pay | Admitting: Family

## 2019-01-13 DIAGNOSIS — E119 Type 2 diabetes mellitus without complications: Secondary | ICD-10-CM

## 2019-01-13 MED ORDER — ROSUVASTATIN CALCIUM 10 MG PO TABS: 10.0000 mg | ORAL_TABLET | Freq: Every day | ORAL | 3 refills | Status: DC

## 2019-01-13 MED ORDER — LISINOPRIL 5 MG PO TABS: 5.0000 mg | ORAL_TABLET | Freq: Every day | ORAL | 3 refills | Status: DC

## 2019-01-23 ENCOUNTER — Ambulatory Visit: Payer: Self-pay | Admitting: *Deleted

## 2019-01-23 NOTE — Telephone Encounter (Signed)
Patient has been scheduled doxy appointment for tomorrow at 1030

## 2019-01-23 NOTE — Telephone Encounter (Signed)
Patient began taking lisinopril 5 mg and crestor 10 mg tabs one week ago. She takes both at bedtime. Yesterday, she woke up feeling swimmy headed both while resting and standing. She has nausea today. No travels no known exposures. Denies CP/SOB/fever/vision changes. Hydrating sufficiently with 4-6 glasses of water daily. Denies any LE edema.  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 She feels these symptoms are from the medications. She was not home at the time she called for me to transfer to PCP for virtual appointment. She is requesting a call for appointment via smart phone #[phone] please call around 1:00p to set up virtual appointment.routing to PCP to contact patient.    Reason for Disposition . Pharmacy calling with prescription questions and triager unable to answer question  Answer Assessment - Initial Assessment Questions 1. SYMPTOMS: "Do you have any symptoms?"     Recently started on lisinopril and crestor. Now having dizziness and nausea 2. SEVERITY: If symptoms are present, ask "Are they mild, moderate or severe?"    Mild to moderate  Protocols used: MEDICATION QUESTION CALL-A-AH

## 2019-01-24 ENCOUNTER — Ambulatory Visit (INDEPENDENT_AMBULATORY_CARE_PROVIDER_SITE_OTHER): Payer: Managed Care, Other (non HMO) | Admitting: Family

## 2019-01-24 ENCOUNTER — Other Ambulatory Visit: Payer: Self-pay | Admitting: Family

## 2019-01-24 ENCOUNTER — Encounter: Payer: Self-pay | Admitting: Family

## 2019-01-24 DIAGNOSIS — I10 Essential (primary) hypertension: Secondary | ICD-10-CM

## 2019-01-24 DIAGNOSIS — R42 Dizziness and giddiness: Secondary | ICD-10-CM | POA: Insufficient documentation

## 2019-01-24 NOTE — Assessment & Plan Note (Signed)
Close to goal.  Discussed with patient her diastolic blood pressure is slightly on the low end.  I prefer if we reach for closer to 120/80 with lifestyle modifications.  She agreed with this plan.  Advised low-salt, exercise for weight loss.

## 2019-01-24 NOTE — Progress Notes (Signed)
This visit type was conducted due to national recommendations for restrictions regarding the COVID-19 pandemic (e.g. social distancing).  This format is felt to be most appropriate for this patient at this time.  All issues noted in this document were discussed and addressed.  No physical exam was performed (except for noted visual exam findings with Video Visits). Virtual Visit via Video Note  I connected with@  on 01/24/19 at 10:30 AM EDT by a video enabled telemedicine application and verified that I am speaking with the correct person using two identifiers.  Location patient: home Location provider:work Persons participating in the virtual visit: patient, provider  I discussed the limitations of evaluation and management by telemedicine and the availability of in person appointments. The patient expressed understanding and agreed to proceed.   HPI:   Complains of 'swimmy headed'  Episode 2 days ago, onset was when woke up and got OOB. lasted all day. 'head was spinning.' If moved head, felt like it 'would spin more.' even when close her eyes which made her nausea. 2 episodes of non bloody emesis yesterday.  All symptoms resolved as of today.  Thinks right ear may muffled. NO hearing changes, tinnitus, discharge, sinus pain, congestion, cough, ha.    No cp, sob, vision changes, fever, syncope.   Drinking 4-6 glasses of water per dat.   HTN- compliant with lisinopril, amlodipine BP at home 128/72, 123/65, 123/83. No CP.   HLD- compliant with crestor   ROS: See pertinent positives and negatives per HPI.  Past Medical History:  Diagnosis Date  . Asthma    AS A CHILD- NO INHALERS  . Dizziness    happened when first diagnosed with hypertension  . Elevated blood pressure reading 08/30/2016  . Frequent headaches   . GERD (gastroesophageal reflux disease)    OCC-TUMS PRN  . Hypertension   . RUQ pain 08/30/2016    Past Surgical History:  Procedure Laterality Date  .  CHOLECYSTECTOMY N/A 02/01/2017   Procedure: LAPAROSCOPIC CHOLECYSTECTOMY;  Surgeon: Ricarda FrameWoodham, Charles, MD;  Location: [facility] ORS;  Service: General;  Laterality: N/A;  . NO PAST SURGERIES      Family History  Problem Relation Age of Onset  . Arthritis Mother   . Hyperlipidemia Mother   . Hypertension Mother   . Arthritis Father   . Hyperlipidemia Father   . Heart disease Father   . Hypertension Father   . GER disease Father   . Alcohol abuse Paternal Uncle   . Cancer Paternal Uncle   . Arthritis Maternal Grandmother   . Hyperlipidemia Maternal Grandmother   . Heart disease Maternal Grandmother   . Hypertension Maternal Grandmother   . Diabetes Maternal Grandmother   . Breast cancer Maternal Grandmother [id number]  . Arthritis Maternal Grandfather   . Hyperlipidemia Maternal Grandfather   . Alcohol abuse Paternal Grandmother   . Arthritis Paternal Grandmother   . Hyperlipidemia Paternal Grandmother   . Hypertension Paternal Grandmother   . Mental illness Paternal Grandmother   . Alcohol abuse Paternal Grandfather   . Arthritis Paternal Grandfather   . Hyperlipidemia Paternal Grandfather   . Hyperlipidemia Maternal Aunt   . Hypertension Maternal Aunt   . Diabetes Maternal Aunt   . Breast cancer Maternal Aunt 40  . Cancer Maternal Uncle   . Mental illness Paternal Aunt   . Colon cancer Neg Hx     SOCIAL HX: never smoker   Current Outpatient Medications:  .  amLODipine (NORVASC) 5 MG tablet, TAKE 1 TABLET(5  MG) BY MOUTH DAILY, Disp: 90 tablet, Rfl: 0 .  calcium carbonate (TUMS - DOSED IN MG ELEMENTAL CALCIUM) 500 MG chewable tablet, Chew 1 tablet by mouth as needed for indigestion or heartburn., Disp: , Rfl:  .  ibuprofen (ADVIL,MOTRIN) 200 MG tablet, Take 400 mg by mouth every 6 (six) hours as needed for mild pain., Disp: , Rfl:  .  levothyroxine (SYNTHROID, LEVOTHROID) 25 MCG tablet, Take 25 mcg by mouth daily before breakfast., Disp: , Rfl:  .  lisinopril (ZESTRIL) 5 MG tablet,  Take 1 tablet (5 mg total) by mouth daily., Disp: 90 tablet, Rfl: 3 .  metFORMIN (GLUCOPHAGE XR) 500 MG 24 hr tablet, Start 500mg  PO qpm., Disp: 90 tablet, Rfl: 3 .  Multiple Vitamins-Minerals (EQ MULTIVITAMINS ADULT GUMMY PO), Take 2 tablets by mouth daily., Disp: , Rfl:  .  mupirocin cream (BACTROBAN) 2 %, Apply 1 application topically 3 (three) times daily., Disp: 15 g, Rfl: 2 .  rosuvastatin (CRESTOR) 10 MG tablet, Take 1 tablet (10 mg total) by mouth daily., Disp: 90 tablet, Rfl: 3  EXAM:  VITALS per patient if applicable:  GENERAL: alert, oriented, appears well and in no acute distress  HEENT: atraumatic, conjunttiva clear, no obvious abnormalities on inspection of external nose and ears  NECK: normal movements of the head and neck  LUNGS: on inspection no signs of respiratory distress, breathing rate appears normal, no obvious gross SOB, gasping or wheezing  CV: no obvious cyanosis  MS: moves all visible extremities without noticeable abnormality  PSYCH/NEURO: pleasant and cooperative, no obvious depression or anxiety, speech and thought processing grossly intact  ASSESSMENT AND PLAN:  Discussed the following assessment and plan:  Hypertension, unspecified type  Vertigo Problem List Items Addressed This Visit      Cardiovascular and Mediastinum   HTN (hypertension)    Close to goal.  Discussed with patient her diastolic blood pressure is slightly on the low end.  I prefer if we reach for closer to 120/80 with lifestyle modifications.  She agreed with this plan.  Advised low-salt, exercise for weight loss.        Other   Vertigo    Resolved at this time. Discussed at great length the limitations of telemedicine.  In particular I was unable to perform a physical exam today.  Based on presentation, onset, discussed with patient my differential diagnoses including benign positional vertigo, also considering orthostatic hypotension.  At this time awaiting on patient to send  orthostatics readings from home. Given patient information on both in after visit summary.  She will certainly monitor for new symptoms or symptoms return.  She will let me know how she is doing            I discussed the assessment and treatment plan with the patient. The patient was provided an opportunity to ask questions and all were answered. The patient agreed with the plan and demonstrated an understanding of the instructions.   The patient was advised to call back or seek an in-person evaluation if the symptoms worsen or if the condition fails to improve as anticipated.   [name], FNP

## 2019-01-24 NOTE — Assessment & Plan Note (Signed)
Resolved at this time. Discussed at great length the limitations of telemedicine.  In particular I was unable to perform a physical exam today.  Based on presentation, onset, discussed with patient my differential diagnoses including benign positional vertigo, also considering orthostatic hypotension.  At this time awaiting on patient to send orthostatics readings from home. Given patient information on both in after visit summary.  She will certainly monitor for new symptoms or symptoms return.  She will let me know how she is doing

## 2019-01-24 NOTE — Patient Instructions (Signed)
As discussed, I suspect either benign positional vertigo or orthostatic hypotension.  Will await blood pressure readings from home. Certainly if you develop symptoms again, let me know asap.    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   You may look videos for Epley's maneuvers as we discussed online.   If dizziness/vertigo persists, a referral to ENT for further evaluation and medication may be appropriate.   If there is no improvement in your symptoms, or if there is any worsening of symptoms, or if you have any additional concerns, please return to this clinic for re-evaluation; or, if we are closed, consider going to the Emergency Room for evaluation.   What is BPPV?  BPPV  is one of the most common causes of vertigo - the sudden sensation that you're spinning or that the inside of your head is spinning. Benign paroxysmal positional vertigo causes brief episodes of mild to intense dizziness. Benign paroxysmal positional vertigo is usually triggered by specific changes in the position of your head. This might occur when you tip your head up or down, when you lie down, or when you turn over or sit up in bed. Although benign paroxysmal positional vertigo can be a bothersome problem, it's rarely serious except when it increases the chance of falls.  If you experience dizziness associated with benign paroxysmal positional vertigo (BPPV), consider these tips: Be aware of the possibility of losing your balance, which can lead to falling and serious injury.  Sit down immediately when you feel dizzy.  Use good lighting if you get up at night.  Walk with a cane for stability if you're at risk of falling.  Work closely with your doctor to manage your symptoms effectively. BPPV may recur even after successful therapy. Fortunately, although there's no cure, the  condition can be managed with physical therapy and home treatments.   Dizziness [Uncertain Cause] Dizziness is a common symptom sometimes described as "lightheadedness" or feeling like you are going to faint. If it lasts for only a few seconds and is related to changes in position (such as getting up after lying or sitting for a long time), it is usually not a sign of anything serious. Dizziness that lasts for minutes to hours, or comes on for no apparent reason, may be a sign of a more serious problem (such as dehydration, a medicine reaction, disease of the heart or brain). Today's exam did not show an exact cause for your dizzy spell . Sometimes additional tests are required before a cause can be found. Therefore, it is important to follow up with your doctor if your symptoms continue. Home Care: 1) If a dizzy spell occurs and lasts more than a few seconds, lie down until it passes. If you are lying down, then you cannot hurt yourself by falling if you do faint. 2) Do not drive or operate dangerous equipment until the dizzy spells have stopped for at least 48 hours. 3) If dizzy spells occur with sudden standing, this may be a sign of mild dehydration. Drink extra fluids over the next few days. 4) If you recently started a new medicine or if you had the dose of a current medicine increased (especially blood pressure medicine), talk with the prescribing doctor about your symptoms. Dose adjustments may be needed. Follow Up with your doctor for further evaluation within the next seven days, if your  symptoms continue. Get Prompt Medical Attention if any of the following occur: -- Worsening of your symptoms -- Fainting, headache or seizure -- Repeated vomiting -- Feeling like you or the room is spinning -- Chest, arm, neck, back or jaw pain -- Palpitations (the sense that your heart is fluttering or beating fast or hard) -- Shortness of breath -- Blood in vomit or stool (black or red color) --  Weakness of an arm or leg or one side of the face -- Difficulty with speech or vision  2000-2015 [employer] [address], [place], [place] [address]. All rights reserved. This information is not intended as a substitute for professional medical care. Always follow your healthcare professional's instructions.  Orthostatic Hypotension Blood pressure is a measurement of how strongly, or weakly, your blood is pressing against the walls of your arteries. Orthostatic hypotension is a sudden drop in blood pressure that happens when you quickly change positions, such as when you get up from sitting or lying down. Arteries are blood vessels that carry blood from your heart throughout your body. When blood pressure is too low, you may not get enough blood to your brain or to the rest of your organs. This can cause weakness, light-headedness, rapid heartbeat, and fainting. This can last for just a few seconds or for up to a few minutes. Orthostatic hypotension is usually not a serious problem. However, if it happens frequently or gets worse, it may be a sign of something more serious. What are the causes? This condition may be caused by:  Sudden changes in posture, such as standing up quickly after you have been sitting or lying down.  Blood loss.  Loss of body fluids (dehydration).  Heart problems.  Hormone (endocrine) problems.  Pregnancy.  Severe infection.  Lack of certain nutrients.  Severe allergic reactions (anaphylaxis).  Certain medicines, such as blood pressure medicine or medicines that make the body lose excess fluids (diuretics). Sometimes, this condition can be caused by not taking medicine as directed, such as taking too much of a certain medicine. What increases the risk? The following factors may make you more likely to develop this condition:  Age. Risk increases as you get older.  Conditions that affect the heart or the central nervous system.  Taking  certain medicines, such as blood pressure medicine or diuretics.  Being pregnant. What are the signs or symptoms? Symptoms of this condition may include:  Weakness.  Light-headedness.  Dizziness.  Blurred vision.  Fatigue.  Rapid heartbeat.  Fainting, in severe cases. How is this diagnosed? This condition is diagnosed based on:  Your medical history.  Your symptoms.  Your blood pressure measurement. Your health care provider will check your blood pressure when you are: ? Lying down. ? Sitting. ? Standing. A blood pressure reading is recorded as two numbers, such as "120 over 80" (or 120/80). The first ("top") number is called the systolic pressure. It is a measure of the pressure in your arteries as your heart beats. The second ("bottom") number is called the diastolic pressure. It is a measure of the pressure in your arteries when your heart relaxes between beats. Blood pressure is measured in a unit called mm Hg. Healthy blood pressure for most adults is 120/80. If your blood pressure is below 90/60, you may be diagnosed with hypotension. Other information or tests that may be used to diagnose orthostatic hypotension include:  Your other vital signs, such as your heart rate and temperature.  Blood  tests.  Tilt table test. For this test, you will be safely secured to a table that moves you from a lying position to an upright position. Your heart rhythm and blood pressure will be monitored during the test. How is this treated? This condition may be treated by:  Changing your diet. This may involve eating more salt (sodium) or drinking more water.  Taking medicines to raise your blood pressure.  Changing the dosage of certain medicines you are taking that might be lowering your blood pressure.  Wearing compression stockings. These stockings help to prevent blood clots and reduce swelling in your legs. In some cases, you may need to go to the hospital for:  Fluid  replacement. This means you will receive fluids through an IV.  Blood replacement. This means you will receive donated blood through an IV (transfusion).  Treating an infection or heart problems, if this applies.  Monitoring. You may need to be monitored while medicines that you are taking wear off. Follow these instructions at home: Eating and drinking   Drink enough fluid to keep your urine pale yellow.  Eat a healthy diet, and follow instructions from your health care provider about eating or drinking restrictions. A healthy diet includes: ? Fresh fruits and vegetables. ? Whole grains. ? Lean meats. ? Low-fat dairy products.  Eat extra salt only as directed. Do not add extra salt to your diet unless your health care provider told you to do that.  Eat frequent, small meals.  Avoid standing up suddenly after eating. Medicines  Take over-the-counter and prescription medicines only as told by your health care provider. ? Follow instructions from your health care provider about changing the dosage of your current medicines, if this applies. ? Do not stop or adjust any of your medicines on your own. General instructions   Wear compression stockings as told by your health care provider.  Get up slowly from lying down or sitting positions. This gives your blood pressure a chance to adjust.  Avoid hot showers and excessive heat as directed by your health care provider.  Return to your normal activities as told by your health care provider. Ask your health care provider what activities are safe for you.  Do not use any products that contain nicotine or tobacco, such as cigarettes, e-cigarettes, and chewing tobacco. If you need help quitting, ask your health care provider.  Keep all follow-up visits as told by your health care provider. This is important. Contact a health care provider if you:  Vomit.  Have diarrhea.  Have a fever for more than 2-3 days.  Feel more thirsty  than usual.  Feel weak and tired. Get help right away if you:  Have chest pain.  Have a fast or irregular heartbeat.  Develop numbness in any part of your body.  Cannot move your arms or your legs.  Have trouble speaking.  Become sweaty or feel light-headed.  Faint.  Feel short of breath.  Have trouble staying awake.  Feel confused. Summary  Orthostatic hypotension is a sudden drop in blood pressure that happens when you quickly change positions.  Orthostatic hypotension is usually not a serious problem.  It is diagnosed by having your blood pressure taken lying down, sitting, and then standing.  It may be treated by changing your diet or adjusting your medicines. This information is not intended to replace advice given to you by your health care provider. Make sure you discuss any questions you have with your health  care provider. Document Released: 09/01/2002 Document Revised: 03/07/2018 Document Reviewed: 03/07/2018 Elsevier Interactive Patient Education  [employer].

## 2019-01-25 LAB — BASIC METABOLIC PANEL
BUN/Creatinine Ratio: 16 (ref 9–23)
BUN: 12 mg/dL (ref 6–20)
CO2: 23 mmol/L (ref 20–29)
Calcium: 9.5 mg/dL (ref 8.7–10.2)
Chloride: 100 mmol/L (ref 96–106)
Creatinine, Ser: 0.77 mg/dL (ref 0.57–1.00)
GFR calc Af Amer: 117 mL/min/{1.73_m2} (ref >59)
GFR calc non Af Amer: 102 mL/min/{1.73_m2} (ref >59)
Glucose: 143 mg/dL — ABNORMAL HIGH (ref 65–99)
Potassium: 4.6 mmol/L (ref 3.5–5.2)
Sodium: 140 mmol/L (ref 134–144)

## 2019-04-02 ENCOUNTER — Other Ambulatory Visit: Payer: Self-pay | Admitting: Family

## 2019-04-02 DIAGNOSIS — R030 Elevated blood-pressure reading, without diagnosis of hypertension: Secondary | ICD-10-CM

## 2019-04-09 ENCOUNTER — Other Ambulatory Visit: Payer: Self-pay | Admitting: Family

## 2019-04-09 ENCOUNTER — Encounter: Payer: Self-pay | Admitting: Family

## 2019-04-09 DIAGNOSIS — E119 Type 2 diabetes mellitus without complications: Secondary | ICD-10-CM

## 2019-04-09 DIAGNOSIS — R030 Elevated blood-pressure reading, without diagnosis of hypertension: Secondary | ICD-10-CM

## 2019-04-09 MED ORDER — AMLODIPINE BESYLATE 5 MG PO TABS: 5.0000 mg | ORAL_TABLET | Freq: Every day | ORAL | 1 refills | Status: DC

## 2019-04-11 MED ORDER — LISINOPRIL 5 MG PO TABS: 5.0000 mg | ORAL_TABLET | Freq: Every day | ORAL | 3 refills | Status: DC

## 2019-04-14 ENCOUNTER — Other Ambulatory Visit: Payer: Self-pay

## 2019-08-04 ENCOUNTER — Other Ambulatory Visit: Payer: Self-pay

## 2019-08-04 ENCOUNTER — Ambulatory Visit (INDEPENDENT_AMBULATORY_CARE_PROVIDER_SITE_OTHER): Payer: Managed Care, Other (non HMO) | Admitting: Family Medicine

## 2019-08-04 ENCOUNTER — Encounter: Payer: Self-pay | Admitting: Family Medicine

## 2019-08-04 VITALS — Ht 69.0 in | Wt 323.0 lb

## 2019-08-04 DIAGNOSIS — H938X3 Other specified disorders of ear, bilateral: Secondary | ICD-10-CM | POA: Diagnosis not present

## 2019-08-04 DIAGNOSIS — Z20822 Contact with and (suspected) exposure to covid-19: Secondary | ICD-10-CM

## 2019-08-04 DIAGNOSIS — R42 Dizziness and giddiness: Secondary | ICD-10-CM | POA: Diagnosis not present

## 2019-08-04 DIAGNOSIS — Z20828 Contact with and (suspected) exposure to other viral communicable diseases: Secondary | ICD-10-CM | POA: Diagnosis not present

## 2019-08-04 DIAGNOSIS — R110 Nausea: Secondary | ICD-10-CM | POA: Diagnosis not present

## 2019-08-04 MED ORDER — DOXYCYCLINE HYCLATE 100 MG PO TABS: 100.0000 mg | ORAL_TABLET | Freq: Two times a day (BID) | ORAL | 0 refills | Status: DC

## 2019-08-04 MED ORDER — MECLIZINE HCL 12.5 MG PO TABS: 12.5000 mg | ORAL_TABLET | Freq: Three times a day (TID) | ORAL | 0 refills | Status: DC | PRN

## 2019-08-04 NOTE — Progress Notes (Signed)
Patient ID: Laurie Cameron, female   DOB: 06/14/1986, 33 y.o.   MRN: [id number]    Virtual Visit via video Note  This visit type was conducted due to national recommendations for restrictions regarding the COVID-19 pandemic (e.g. social distancing).  This format is felt to be most appropriate for this patient at this time.  All issues noted in this document were discussed and addressed.  No physical exam was performed (except for noted visual exam findings with Video Visits).   I connected with Bassy Fetterly today at  1:40 PM EST by a video enabled telemedicine application and verified that I am speaking with the correct person using two identifiers. Location patient: home Location provider: work or home office Persons participating in the virtual visit: patient, provider  I discussed the limitations, risks, security and privacy concerns of performing an evaluation and management service by video and the availability of in person appointments. I also discussed with the patient that there may be a patient responsible charge related to this service. The patient expressed understanding and agreed to proceed.  HPI:  Patient and I connected via video due to complaints of dizziness, headache and ear fullness for the past 2 days. No Fever or chills, denies cough, denies shortness of breath, denies chest denies body aches.  Patient states she has had similar episode about 6 months ago & PCP thought it was vertigo.  States, 6 months ago her episodes did not linger.    ROS: See pertinent positives and negatives per HPI.  Past Medical History:  Diagnosis Date  . Asthma    AS A CHILD- NO INHALERS  . Dizziness    happened when first diagnosed with hypertension  . Elevated blood pressure reading 08/30/2016  . Frequent headaches   . GERD (gastroesophageal reflux disease)    OCC-TUMS PRN  . Hypertension   . RUQ pain 08/30/2016    Past Surgical History:  Procedure Laterality Date  .  CHOLECYSTECTOMY N/A 02/01/2017   Procedure: LAPAROSCOPIC CHOLECYSTECTOMY;  Surgeon: Ricarda Frame, MD;  Location: [facility] ORS;  Service: General;  Laterality: N/A;  . NO PAST SURGERIES      Family History  Problem Relation Age of Onset  . Arthritis Mother   . Hyperlipidemia Mother   . Hypertension Mother   . Arthritis Father   . Hyperlipidemia Father   . Heart disease Father   . Hypertension Father   . GER disease Father   . Alcohol abuse Paternal Uncle   . Cancer Paternal Uncle   . Arthritis Maternal Grandmother   . Hyperlipidemia Maternal Grandmother   . Heart disease Maternal Grandmother   . Hypertension Maternal Grandmother   . Diabetes Maternal Grandmother   . Breast cancer Maternal Grandmother 66  . Arthritis Maternal Grandfather   . Hyperlipidemia Maternal Grandfather   . Alcohol abuse Paternal Grandmother   . Arthritis Paternal Grandmother   . Hyperlipidemia Paternal Grandmother   . Hypertension Paternal Grandmother   . Mental illness Paternal Grandmother   . Alcohol abuse Paternal Grandfather   . Arthritis Paternal Grandfather   . Hyperlipidemia Paternal Grandfather   . Hyperlipidemia Maternal Aunt   . Hypertension Maternal Aunt   . Diabetes Maternal Aunt   . Breast cancer Maternal Aunt 40  . Cancer Maternal Uncle   . Mental illness Paternal Aunt   . Colon cancer Neg Hx    Social History   Tobacco Use  . Smoking status: Never Smoker  . Smokeless tobacco: Never Used  Substance  Use Topics  . Alcohol use: Yes    Comment: RARE     Current Outpatient Medications:  .  amLODipine (NORVASC) 5 MG tablet, Take 1 tablet (5 mg total) by mouth daily., Disp: 90 tablet, Rfl: 1 .  calcium carbonate (TUMS - DOSED IN MG ELEMENTAL CALCIUM) 500 MG chewable tablet, Chew 1 tablet by mouth as needed for indigestion or heartburn., Disp: , Rfl:  .  ibuprofen (ADVIL,MOTRIN) 200 MG tablet, Take 400 mg by mouth every 6 (six) hours as needed for mild pain., Disp: , Rfl:  .   levothyroxine (SYNTHROID, LEVOTHROID) 25 MCG tablet, Take 25 mcg by mouth daily before breakfast., Disp: , Rfl:  .  lisinopril (ZESTRIL) 5 MG tablet, Take 1 tablet (5 mg total) by mouth daily., Disp: 90 tablet, Rfl: 3 .  metFORMIN (GLUCOPHAGE XR) 500 MG 24 hr tablet, Start 500mg  PO qpm., Disp: 90 tablet, Rfl: 3 .  Multiple Vitamins-Minerals (EQ MULTIVITAMINS ADULT GUMMY PO), Take 2 tablets by mouth daily., Disp: , Rfl:  .  rosuvastatin (CRESTOR) 10 MG tablet, Take 1 tablet (10 mg total) by mouth daily., Disp: 90 tablet, Rfl: 3 .  mupirocin cream (BACTROBAN) 2 %, Apply 1 application topically 3 (three) times daily. (Patient not taking: Reported on 08/04/2019), Disp: 15 g, Rfl: 2  EXAM:  GENERAL: alert, oriented, appears well and in no acute distress  HEENT: atraumatic, conjunttiva clear, no obvious abnormalities on inspection of external nose and ears  NECK: normal movements of the head and neck  LUNGS: on inspection no signs of respiratory distress, breathing rate appears normal, no obvious gross SOB, gasping or wheezing  CV: no obvious cyanosis  MS: moves all visible extremities without noticeable abnormality  PSYCH/NEURO: pleasant and cooperative, no obvious depression or anxiety, speech and thought processing grossly intact  ASSESSMENT AND PLAN:  Discussed the following assessment and plan:  Dizziness - Plan: meclizine (ANTIVERT) 12.5 MG tablet  Nausea - Plan: meclizine (ANTIVERT) 12.5 MG tablet  Suspected 2019 novel coronavirus infection  Ear fullness, bilateral - Plan: doxycycline (VIBRA-TABS) 100 MG tablet, meclizine (ANTIVERT) 12.5 MG tablet  Symptoms could be from ear infection, could also be from vertigo.  We will get her tested for COVID-19 due to the headache and nausea being present and this potentially being COVID-19 symptoms.  We will also treat her with antibiotic to cover for possible ear infection.  Patient will remain out of work at this time due to being tested  for COVID-19.    I discussed the assessment and treatment plan with the patient. The patient was provided an opportunity to ask questions and all were answered. The patient agreed with the plan and demonstrated an understanding of the instructions.   The patient was advised to call back or seek an in-person evaluation if the symptoms worsen or if the condition fails to improve as anticipated.  Jodelle Green, FNP

## 2019-08-05 ENCOUNTER — Other Ambulatory Visit: Payer: Self-pay

## 2019-08-05 DIAGNOSIS — Z20822 Contact with and (suspected) exposure to covid-19: Secondary | ICD-10-CM

## 2019-08-07 ENCOUNTER — Encounter: Payer: Self-pay | Admitting: Family Medicine

## 2019-08-07 LAB — NOVEL CORONAVIRUS, NAA: SARS-CoV-2, NAA: NOT DETECTED

## 2019-08-07 NOTE — Telephone Encounter (Signed)
Yes note to go back is fine!  LG

## 2019-08-19 ENCOUNTER — Encounter: Payer: Self-pay | Admitting: Family

## 2019-08-19 DIAGNOSIS — R42 Dizziness and giddiness: Secondary | ICD-10-CM

## 2019-08-19 DIAGNOSIS — H93231 Hyperacusis, right ear: Secondary | ICD-10-CM

## 2019-08-20 NOTE — Telephone Encounter (Signed)
Patient saw Lauren for this problem 2 weeks ago now would like ENT referral.

## 2019-09-09 ENCOUNTER — Other Ambulatory Visit: Payer: Self-pay | Admitting: Family

## 2019-09-09 DIAGNOSIS — E119 Type 2 diabetes mellitus without complications: Secondary | ICD-10-CM

## 2019-09-10 ENCOUNTER — Other Ambulatory Visit: Payer: Self-pay | Admitting: Otolaryngology

## 2019-09-10 DIAGNOSIS — H9311 Tinnitus, right ear: Secondary | ICD-10-CM

## 2019-09-10 DIAGNOSIS — R42 Dizziness and giddiness: Secondary | ICD-10-CM

## 2019-09-17 ENCOUNTER — Encounter: Payer: Managed Care, Other (non HMO) | Admitting: Family

## 2019-09-18 ENCOUNTER — Ambulatory Visit
Admission: RE | Admit: 2019-09-18 | Discharge: 2019-09-18 | Disposition: A | Discharge status: Home / Self Care | Payer: Managed Care, Other (non HMO) | Source: Ambulatory Visit | Attending: Otolaryngology | Admitting: Otolaryngology

## 2019-09-18 ENCOUNTER — Other Ambulatory Visit: Payer: Self-pay

## 2019-09-18 DIAGNOSIS — R42 Dizziness and giddiness: Secondary | ICD-10-CM | POA: Diagnosis present

## 2019-09-18 DIAGNOSIS — H9311 Tinnitus, right ear: Secondary | ICD-10-CM | POA: Insufficient documentation

## 2019-09-18 LAB — POCT I-STAT CREATININE: Creatinine, Ser: 0.7 mg/dL (ref 0.44–1.00)

## 2019-09-18 MED ORDER — GADOBUTROL 1 MMOL/ML IV SOLN
10.0000 mL | Freq: Once | INTRAVENOUS | Status: AC | PRN
  Administered 2019-09-18: 10 mL via INTRAVENOUS

## 2019-10-09 ENCOUNTER — Telehealth: Payer: Managed Care, Other (non HMO) | Admitting: Physician Assistant

## 2019-10-09 DIAGNOSIS — R0981 Nasal congestion: Secondary | ICD-10-CM

## 2019-10-09 DIAGNOSIS — R52 Pain, unspecified: Secondary | ICD-10-CM

## 2019-10-09 DIAGNOSIS — R05 Cough: Secondary | ICD-10-CM

## 2019-10-09 DIAGNOSIS — R059 Cough, unspecified: Secondary | ICD-10-CM

## 2019-10-09 MED ORDER — BENZONATATE 100 MG PO CAPS: 100.0000 mg | ORAL_CAPSULE | Freq: Three times a day (TID) | ORAL | 0 refills | Status: DC | PRN

## 2019-10-09 MED ORDER — FLUTICASONE PROPIONATE 50 MCG/ACT NA SUSP: 2.0000 | Freq: Every day | NASAL | 0 refills | Status: DC

## 2019-10-09 NOTE — Progress Notes (Signed)
E-Visit for Corona Virus Screening  Your current symptoms could be consistent with the coronavirus.  Many health care providers can now test patients at their office but not all are.  [facility] has multiple testing sites. For information on our [place] testing locations and hours go to [email or web address]  We are enrolling you in our [place] for [place] . Daily you will receive a questionnaire within the [place] website. Our COVID 19 response team will be monitoring your responses daily.  Testing Information: The COVID-19 Community Testing sites will begin testing BY APPOINTMENT ONLY.  You can schedule online at [email or web address]  If you do not have access to a smart phone or computer you may call [phone] for an appointment.  Testing Locations: Appointment schedule is 8 am to 3:30 pm at all sites  [facility] indoors at [address] [place] [address] [facility]  indoors at [place]. [address], [place], [place] [address] [place] indoors at [address], [place] [address]  Additional testing sites in the Community:  . For [facility] Testing sites in [facility]  FaceUpdate.uy  . For Pop-up testing sites in [place]  BowlDirectory.co.uk  . For Testing sites with regular hours [email or web address].[email or web address]  . For [place] [email or web address].si  . For [facility] BasicJet.[email or web address]  . For [facility] testing in [place] and [employer] [email or web address]  . For [facility] testing in [facility]   [email or web address].[email or web address]  For  more information  about community testing call [phone]   We are enrolling you in our [place] for [place] . Daily you will receive a questionnaire within the [place] website. Our COVID 19 response team will be monitoring your responses daily.  Please quarantine yourself while awaiting your test results. Please stay home for a minimum of 10 days from the first day of illness with improving symptoms and you have had 24 hours of no fever (without the use of Tylenol (Acetaminophen) Motrin (Ibuprofen) or any fever reducing medication).  Also - Do not get tested prior to returning to [facility] because once you have had a positive test the test can stay positive for more then a month in some cases.   You should wear a mask or cloth face covering over your nose and mouth if you must be around other people or animals, including pets (even at home). Try to stay at least 6 feet away from other people. This will protect the people around you.  Please continue good preventive care measures, including:  frequent hand-washing, avoid touching your face, cover coughs/sneezes, stay out of crowds and keep a 6 foot distance from others.  COVID-19 is a respiratory illness with symptoms that are similar to the flu. Symptoms are typically mild to moderate, but there have been cases of severe illness and death due to the virus.   The following symptoms may appear 2-14 days after exposure: . Fever . Cough . Shortness of breath or difficulty breathing . Chills . Repeated shaking with chills . Muscle pain . Headache . Sore throat . New loss of taste or smell . Fatigue . Congestion or runny nose . Nausea or vomiting . Diarrhea  Go to the nearest hospital ED for assessment if fever/cough/breathlessness are severe or illness seems like a threat to life.  It is vitally important that if you feel that you have an infection such as this virus or any other virus that you stay home and away from places  where you may spread it to  others.  You should avoid contact with people age 35 and older.   You can use medication such as A prescription cough medication called Tessalon Perles 100 mg. You may take 1-2 capsules every 8 hours as needed for cough. I have also prescribed fluticasone nasal spray to be used daily for nasal congestion.   You may also take acetaminophen (Tylenol) as needed for fever. 1-2 tablets every 6 hours as needed for fever or pain.   Reduce your risk of any infection by using the same precautions used for avoiding the common cold or flu:  Marland Kitchen Wash your hands often with soap and warm water for at least 20 seconds.  If soap and water are not readily available, use an alcohol-based hand sanitizer with at least 60% alcohol.  . If coughing or sneezing, cover your mouth and nose by coughing or sneezing into the elbow areas of your shirt or coat, into a tissue or into your sleeve (not your hands). . Avoid shaking hands with others and consider head nods or verbal greetings only. . Avoid touching your eyes, nose, or mouth with unwashed hands.  . Avoid close contact with people who are sick. . Avoid places or events with large numbers of people in one location, like concerts or sporting events. . Carefully consider travel plans you have or are making. . If you are planning any travel outside or inside the [place], visit the CDC's Travelers' Health webpage for the latest health notices. . If you have some symptoms but not all symptoms, continue to monitor at home and seek medical attention if your symptoms worsen. . If you are having a medical emergency, call 911.  HOME CARE . Only take medications as instructed by your medical team. . Drink plenty of fluids and get plenty of rest. . A steam or ultrasonic humidifier can help if you have congestion.   GET HELP RIGHT AWAY IF YOU HAVE EMERGENCY WARNING SIGNS** FOR COVID-19. If you or someone is showing any of these signs seek emergency medical care immediately. Call 911  or proceed to your closest emergency facility if: . You develop worsening high fever. . Trouble breathing . Bluish lips or face . Persistent pain or pressure in the chest . New confusion . Inability to wake or stay awake . You cough up blood. . Your symptoms become more severe  **This list is not all possible symptoms. Contact your medical provider for any symptoms that are sever or concerning to you.  MAKE SURE YOU   Understand these instructions.  Will watch your condition.  Will get help right away if you are not doing well or get worse.  Your e-visit answers were reviewed by a board certified advanced clinical practitioner to complete your personal care plan.  Depending on the condition, your plan could have included both over the counter or prescription medications.  If there is a problem please reply once you have received a response from your provider.  Your safety is important to [place].  If you have drug allergies check your prescription carefully.    You can use MyChart to ask questions about today's visit, request a non-urgent call back, or ask for a work or school excuse for 24 hours related to this e-Visit. If it has been greater than 24 hours you will need to follow up with your provider, or enter a new e-Visit to address those concerns. You will get an e-mail in the next  two days asking about your experience.  I hope that your e-visit has been valuable and will speed your recovery. Thank you for using e-visits.  Greater than 5 minutes, yet less than 10 minutes of time have been spent researching, coordinating, and implementing care for this patient today.

## 2019-10-10 ENCOUNTER — Ambulatory Visit: Payer: Managed Care, Other (non HMO) | Attending: Internal Medicine

## 2019-10-10 DIAGNOSIS — Z20822 Contact with and (suspected) exposure to covid-19: Secondary | ICD-10-CM

## 2019-10-11 LAB — NOVEL CORONAVIRUS, NAA: SARS-CoV-2, NAA: DETECTED — AB

## 2019-10-12 ENCOUNTER — Telehealth: Payer: Self-pay | Admitting: Infectious Diseases

## 2019-10-12 NOTE — Telephone Encounter (Signed)
Called to discuss with patient about Covid symptoms and the use of bamlanivimab, a monoclonal antibody infusion for those with mild to moderate Covid symptoms and at a high risk of hospitalization.  Pt is qualified for this infusion at the [facility] infusion center due to Diabetes and BMI>35.    Per chart review Sx started 1/13.   She would like to discuss with her primary care team and get back with me.

## 2019-10-14 ENCOUNTER — Other Ambulatory Visit: Payer: Self-pay

## 2019-10-15 ENCOUNTER — Ambulatory Visit (INDEPENDENT_AMBULATORY_CARE_PROVIDER_SITE_OTHER): Payer: Managed Care, Other (non HMO) | Admitting: Family

## 2019-10-15 ENCOUNTER — Other Ambulatory Visit: Payer: Self-pay

## 2019-10-15 ENCOUNTER — Encounter: Payer: Self-pay | Admitting: Family

## 2019-10-15 VITALS — Ht 69.0 in | Wt 323.0 lb

## 2019-10-15 DIAGNOSIS — R42 Dizziness and giddiness: Secondary | ICD-10-CM

## 2019-10-15 DIAGNOSIS — I10 Essential (primary) hypertension: Secondary | ICD-10-CM | POA: Diagnosis not present

## 2019-10-15 DIAGNOSIS — Z0000 Encounter for general adult medical examination without abnormal findings: Secondary | ICD-10-CM

## 2019-10-15 DIAGNOSIS — E119 Type 2 diabetes mellitus without complications: Secondary | ICD-10-CM | POA: Diagnosis not present

## 2019-10-15 DIAGNOSIS — E039 Hypothyroidism, unspecified: Secondary | ICD-10-CM

## 2019-10-15 DIAGNOSIS — U071 COVID-19: Secondary | ICD-10-CM

## 2019-10-15 NOTE — Assessment & Plan Note (Signed)
Patient is well-appearing over video today.  No acute respiratory distress.  She is afebrile.  Reassurance given in regards to fatigue and that this will take time to resolve.  Patient will let me know if she needs anything at all

## 2019-10-15 NOTE — Assessment & Plan Note (Addendum)
No further episodes. Dr Andee Poles, suspected meniere's disease. Will follow.

## 2019-10-15 NOTE — Assessment & Plan Note (Addendum)
Patient is Covid positive and under quarantine, so unfortunately we have had a VV and we have deferred Pap until follow up.  encouraged more formal exercise program.  Emphasized continuing to do self breast exam at home.  We did discuss doing a baseline mammogram in a couple of years.  Screening labs have been ordered.  Emphasized the importance dermatology appointment for annual skin exam

## 2019-10-15 NOTE — Assessment & Plan Note (Signed)
Doing well metformin, will continue

## 2019-10-15 NOTE — Progress Notes (Signed)
Virtual Visit via Video Note  I connected with@  on 10/15/19 at  8:30 AM EST by a video enabled telemedicine application and verified that I am speaking with the correct person using two identifiers.  Location patient: home Location provider:work  Persons participating in the virtual visit: patient, provider  I discussed the limitations of evaluation and management by telemedicine and the availability of in person appointments. The patient expressed understanding and agreed to proceed.   HPI: CPE Performs SBE at home, no concerns I regards to breasts. No FDR with Breast cancer. Due pap. Trying to walk more, take steps when at work. No formal exercise.  No skin changes. Plans to call and make an appointment with dermatology,   Positive covid 5 days ago, 10/10/19. First noticed couldn't taste anything. No fever, cough, SOB, CP. Endorses fatigue although improving.  Thinks got COVID from work. Plans to go back to work on Monday 1/25.  Had another vertigo episode a couple of months ago with hearing loss, tinnitus and has seen ENT, Dr Pryor Ochoa, suspected meniere's disease.   HTN- compliant with medication. Around 120/70. No CP.   DM- dong well on metformin  HLD- on compliant.  Hypothyroidism-following with Dr. Gabriel Carina, endocrine  ROS: See pertinent positives and negatives per HPI.  Past Medical History:  Diagnosis Date  . Asthma    AS A CHILD- NO INHALERS  . Dizziness    happened when first diagnosed with hypertension  . Elevated blood pressure reading 08/30/2016  . Frequent headaches   . GERD (gastroesophageal reflux disease)    OCC-TUMS PRN  . Hypertension   . RUQ pain 08/30/2016    Past Surgical History:  Procedure Laterality Date  . CHOLECYSTECTOMY N/A 02/01/2017   Procedure: LAPAROSCOPIC CHOLECYSTECTOMY;  Surgeon: Clayburn Pert, MD;  Location: [facility] ORS;  Service: General;  Laterality: N/A;  . NO PAST SURGERIES      Family History  Problem Relation Age of Onset  .  Arthritis Mother   . Hyperlipidemia Mother   . Hypertension Mother   . Arthritis Father   . Hyperlipidemia Father   . Heart disease Father   . Hypertension Father   . GER disease Father   . Alcohol abuse Paternal Uncle   . Cancer Paternal Uncle   . Arthritis Maternal Grandmother   . Hyperlipidemia Maternal Grandmother   . Heart disease Maternal Grandmother   . Hypertension Maternal Grandmother   . Diabetes Maternal Grandmother   . Breast cancer Maternal Grandmother 105  . Arthritis Maternal Grandfather   . Hyperlipidemia Maternal Grandfather   . Alcohol abuse Paternal Grandmother   . Arthritis Paternal Grandmother   . Hyperlipidemia Paternal Grandmother   . Hypertension Paternal Grandmother   . Mental illness Paternal Grandmother   . Alcohol abuse Paternal Grandfather   . Arthritis Paternal Grandfather   . Hyperlipidemia Paternal Grandfather   . Hyperlipidemia Maternal Aunt   . Hypertension Maternal Aunt   . Diabetes Maternal Aunt   . Breast cancer Maternal Aunt 26  . Deep vein thrombosis Maternal Aunt   . Cancer Maternal Uncle   . Mental illness Paternal Aunt   . Colon cancer Neg Hx     SOCIAL HX: non smoker, rare alcohol   Current Outpatient Medications:  .  amLODipine (NORVASC) 5 MG tablet, Take 1 tablet (5 mg total) by mouth daily., Disp: 90 tablet, Rfl: 1 .  calcium carbonate (TUMS - DOSED IN MG ELEMENTAL CALCIUM) 500 MG chewable tablet, Chew 1 tablet by mouth  as needed for indigestion or heartburn., Disp: , Rfl:  .  ibuprofen (ADVIL,MOTRIN) 200 MG tablet, Take 400 mg by mouth every 6 (six) hours as needed for mild pain., Disp: , Rfl:  .  levothyroxine (SYNTHROID, LEVOTHROID) 25 MCG tablet, Take 25 mcg by mouth daily before breakfast., Disp: , Rfl:  .  lisinopril (ZESTRIL) 5 MG tablet, Take 1 tablet (5 mg total) by mouth daily., Disp: 90 tablet, Rfl: 3 .  metFORMIN (GLUCOPHAGE-XR) 500 MG 24 hr tablet, TAKE 1 TABLET BY MOUTH EVERY EVENING, Disp: 90 tablet, Rfl: 3 .   Multiple Vitamins-Minerals (EQ MULTIVITAMINS ADULT GUMMY PO), Take 2 tablets by mouth daily., Disp: , Rfl:  .  rosuvastatin (CRESTOR) 10 MG tablet, Take 1 tablet (10 mg total) by mouth daily., Disp: 90 tablet, Rfl: 3 .  fluticasone (FLONASE) 50 MCG/ACT nasal spray, Place 2 sprays into both nostrils daily. (Patient not taking: Reported on 10/15/2019), Disp: 16 g, Rfl: 0  EXAM:  VITALS per patient if applicable: BP Readings from Last 3 Encounters:  09/13/18 140/90  12/05/17 (!) 162/100  07/02/17 (!) 146/92   Wt Readings from Last 3 Encounters:  10/15/19 (!) 323 lb (146.5 kg)  08/04/19 (!) 323 lb (146.5 kg)  09/13/18 (!) 323 lb 12.8 oz (146.9 kg)    GENERAL: alert, oriented, appears well and in no acute distress  HEENT: atraumatic, conjunttiva clear, no obvious abnormalities on inspection of external nose and ears  NECK: normal movements of the head and neck  LUNGS: on inspection no signs of respiratory distress, breathing rate appears normal, no obvious gross SOB, gasping or wheezing  CV: no obvious cyanosis  MS: moves all visible extremities without noticeable abnormality  PSYCH/NEURO: pleasant and cooperative, no obvious depression or anxiety, speech and thought processing grossly intact  ASSESSMENT AND PLAN:  Discussed the following assessment and plan:  Routine physical examination - Plan: CBC with Differential/Platelet, Comprehensive metabolic panel, Hemoglobin A1c, Lipid panel, VITAMIN D 25 Hydroxy (Vit-D Deficiency, Fractures)  Vertigo  Hypothyroidism, unspecified type  Hypertension, unspecified type  Diabetes mellitus without complication (HCC)  COVID-19 Problem List Items Addressed This Visit      Cardiovascular and Mediastinum   HTN (hypertension)    Doing well on regimen, will continue.        Endocrine   Diabetes mellitus without complication (HCC)    Doing well metformin, will continue      Hypothyroidism     Other   COVID-19    Patient is  well-appearing over video today.  No acute respiratory distress.  She is afebrile.  Reassurance given in regards to fatigue and that this will take time to resolve.  Patient will let me know if she needs anything at all      Routine physical examination - Primary    Patient is Covid positive and under quarantine, so unfortunately we have had a VV and we have deferred Pap until follow up.  encouraged more formal exercise program.  Emphasized continuing to do self breast exam at home.  We did discuss doing a baseline mammogram in a couple of years.  Screening labs have been ordered.  Emphasized the importance dermatology appointment for annual skin exam      Relevant Orders   CBC with Differential/Platelet   Comprehensive metabolic panel   Hemoglobin A1c   Lipid panel   VITAMIN D 25 Hydroxy (Vit-D Deficiency, Fractures)   Vertigo    No further episodes. Dr Andee Poles, suspected meniere's disease. Will follow.          -  we discussed possible serious and likely etiologies, options for evaluation and workup, limitations of telemedicine visit vs in person visit, treatment, treatment risks and precautions. Pt prefers to treat via telemedicine empirically rather then risking or undertaking an in person visit at this moment. Patient agrees to seek prompt in person care if worsening, new symptoms arise, or if is not improving with treatment.   I discussed the assessment and treatment plan with the patient. The patient was provided an opportunity to ask questions and all were answered. The patient agreed with the plan and demonstrated an understanding of the instructions.   The patient was advised to call back or seek an in-person evaluation if the symptoms worsen or if the condition fails to improve as anticipated.   Mable Paris, FNP

## 2019-10-15 NOTE — Assessment & Plan Note (Signed)
Doing well on regimen, will continue 

## 2019-10-17 ENCOUNTER — Encounter: Payer: Self-pay | Admitting: Family

## 2019-10-21 ENCOUNTER — Telehealth: Payer: Self-pay | Admitting: Family

## 2019-10-21 NOTE — Progress Notes (Signed)
Mychart message sent to patient.

## 2019-10-21 NOTE — Telephone Encounter (Signed)
Called patient regarding visit on 1/20 insurance changes for physicals. Please transfer patient is she calls back

## 2019-11-06 LAB — CBC WITH DIFFERENTIAL/PLATELET
Basophils Absolute: 0.1 10*3/uL (ref 0.0–0.2)
Basos: 1 %
EOS (ABSOLUTE): 0.3 10*3/uL (ref 0.0–0.4)
Eos: 3 %
Hematocrit: 38.9 % (ref 34.0–46.6)
Hemoglobin: 12.8 g/dL (ref 11.1–15.9)
Immature Grans (Abs): 0 10*3/uL (ref 0.0–0.1)
Immature Granulocytes: 0 %
Lymphocytes Absolute: 2.7 10*3/uL (ref 0.7–3.1)
Lymphs: 30 %
MCH: 28 pg (ref 26.6–33.0)
MCHC: 32.9 g/dL (ref 31.5–35.7)
MCV: 85 fL (ref 79–97)
Monocytes Absolute: 0.5 10*3/uL (ref 0.1–0.9)
Monocytes: 5 %
Neutrophils Absolute: 5.5 10*3/uL (ref 1.4–7.0)
Neutrophils: 61 %
Platelets: 278 10*3/uL (ref 150–450)
RBC: 4.57 x10E6/uL (ref 3.77–5.28)
RDW: 12.9 % (ref 11.7–15.4)
WBC: 8.9 10*3/uL (ref 3.4–10.8)

## 2019-11-06 LAB — HEMOGLOBIN A1C
Est. average glucose Bld gHb Est-mCnc: 171 mg/dL
Hgb A1c MFr Bld: 7.6 % — ABNORMAL HIGH (ref 4.8–5.6)

## 2019-11-06 LAB — COMPREHENSIVE METABOLIC PANEL
ALT: 16 IU/L (ref 0–32)
AST: 34 IU/L (ref 0–40)
Albumin/Globulin Ratio: 1.6 (ref 1.2–2.2)
Albumin: 4.2 g/dL (ref 3.8–4.8)
Alkaline Phosphatase: 52 IU/L (ref 39–117)
BUN/Creatinine Ratio: 15 (ref 9–23)
BUN: 12 mg/dL (ref 6–20)
Bilirubin Total: 0.5 mg/dL (ref 0.0–1.2)
CO2: 22 mmol/L (ref 20–29)
Calcium: 8.9 mg/dL (ref 8.7–10.2)
Chloride: 101 mmol/L (ref 96–106)
Creatinine, Ser: 0.81 mg/dL (ref 0.57–1.00)
GFR calc Af Amer: 110 mL/min/{1.73_m2} (ref >59)
GFR calc non Af Amer: 95 mL/min/{1.73_m2} (ref >59)
Globulin, Total: 2.6 g/dL (ref 1.5–4.5)
Glucose: 170 mg/dL — ABNORMAL HIGH (ref 65–99)
Potassium: 4.6 mmol/L (ref 3.5–5.2)
Sodium: 136 mmol/L (ref 134–144)
Total Protein: 6.8 g/dL (ref 6.0–8.5)

## 2019-11-06 LAB — VITAMIN D 25 HYDROXY (VIT D DEFICIENCY, FRACTURES): Vit D, 25-Hydroxy: 24.3 ng/mL — ABNORMAL LOW (ref 30.0–100.0)

## 2019-11-06 LAB — LIPID PANEL
Chol/HDL Ratio: 2.8 ratio (ref 0.0–4.4)
Cholesterol, Total: 154 mg/dL (ref 100–199)
HDL: 56 mg/dL (ref >39)
LDL Chol Calc (NIH): 71 mg/dL (ref 0–99)
Triglycerides: 159 mg/dL — ABNORMAL HIGH (ref 0–149)
VLDL Cholesterol Cal: 27 mg/dL (ref 5–40)

## 2019-11-08 ENCOUNTER — Other Ambulatory Visit: Payer: Self-pay | Admitting: Family

## 2019-11-08 DIAGNOSIS — E119 Type 2 diabetes mellitus without complications: Secondary | ICD-10-CM

## 2019-11-08 DIAGNOSIS — E039 Hypothyroidism, unspecified: Secondary | ICD-10-CM

## 2019-11-08 MED ORDER — METFORMIN HCL ER (MOD) 1000 MG PO TB24: 1000.0000 mg | ORAL_TABLET | Freq: Every evening | ORAL | 2 refills | Status: DC

## 2019-11-11 ENCOUNTER — Other Ambulatory Visit: Payer: Self-pay

## 2019-11-11 DIAGNOSIS — E119 Type 2 diabetes mellitus without complications: Secondary | ICD-10-CM

## 2019-11-11 MED ORDER — METFORMIN HCL ER (MOD) 1000 MG PO TB24: 1000.0000 mg | ORAL_TABLET | Freq: Every evening | ORAL | 2 refills | Status: DC

## 2019-11-27 ENCOUNTER — Other Ambulatory Visit: Payer: Self-pay | Admitting: Family

## 2019-11-27 DIAGNOSIS — R030 Elevated blood-pressure reading, without diagnosis of hypertension: Secondary | ICD-10-CM

## 2019-11-27 DIAGNOSIS — E119 Type 2 diabetes mellitus without complications: Secondary | ICD-10-CM

## 2019-12-02 ENCOUNTER — Encounter: Payer: Self-pay | Admitting: Family

## 2019-12-04 ENCOUNTER — Telehealth: Payer: Self-pay

## 2019-12-04 NOTE — Telephone Encounter (Signed)
PA was done verbally through [facility] for Metfromin (Glumetza 1000MG ) & should receive results within 96 hrs. Reference .

## 2019-12-08 ENCOUNTER — Telehealth: Payer: Self-pay

## 2019-12-08 NOTE — Telephone Encounter (Signed)
Received PA approval from [facility] for Metfromin Tab 1000 ER for 12 months through 12/03/20.

## 2020-03-05 ENCOUNTER — Ambulatory Visit: Payer: Managed Care, Other (non HMO) | Admitting: Family

## 2020-03-08 ENCOUNTER — Ambulatory Visit: Payer: Managed Care, Other (non HMO) | Admitting: Family

## 2020-03-08 ENCOUNTER — Other Ambulatory Visit: Payer: Self-pay

## 2020-03-08 VITALS — BP 128/78 | HR 89 | Temp 98.4°F | Resp 16 | Wt 320.0 lb

## 2020-03-08 DIAGNOSIS — I10 Essential (primary) hypertension: Secondary | ICD-10-CM

## 2020-03-08 DIAGNOSIS — E119 Type 2 diabetes mellitus without complications: Secondary | ICD-10-CM

## 2020-03-08 MED ORDER — LISINOPRIL 5 MG PO TABS: 5.0000 mg | ORAL_TABLET | Freq: Every day | ORAL | 3 refills | Status: DC

## 2020-03-08 MED ORDER — LISINOPRIL 10 MG PO TABS: 10.0000 mg | ORAL_TABLET | Freq: Every day | ORAL | 1 refills | Status: DC

## 2020-03-08 NOTE — Assessment & Plan Note (Signed)
Suspect improved on increased dose of Metformin.  Pending A1c

## 2020-03-08 NOTE — Patient Instructions (Signed)
Start lisinopril 10 mg.  Please monitor blood pressure and ensure is coming down to less than 120/80 over the next week.  Labs in 1 week after starting increased dose of lisinopril.

## 2020-03-08 NOTE — Addendum Note (Signed)
Addended by: Warden Fillers on: 03/08/2020 04:45 PM   Modules accepted: Orders

## 2020-03-08 NOTE — Assessment & Plan Note (Signed)
Slightly elevated.  Discussed with her goal of less than 120/80 particularly in the setting of diabetes.  Patient amenable to increasing lisinopril to 10 mg.  She will repeat BMP in 7 days at [employer] (her work)

## 2020-03-08 NOTE — Progress Notes (Signed)
Subjective:    Patient ID: Laurie Cameron, female    DOB: 05-Dec-1985, 34 y.o.   MRN: [id number]  CC: Laurie Cameron is a 34 y.o. female who presents today for follow up.   HPI: Feels well today, no complaints   dm- compliant with metformin. No numbness or tingling. No wounds in feet. UTD eye exam.   HTN- compliant with medication. BP at home, around 130/80.  No sob, cp, leg swelling.     Follows with endocrine HISTORY:  Past Medical History:  Diagnosis Date  . Asthma    AS A CHILD- NO INHALERS  . Dizziness    happened when first diagnosed with hypertension  . Elevated blood pressure reading 08/30/2016  . Frequent headaches   . GERD (gastroesophageal reflux disease)    OCC-TUMS PRN  . Hypertension   . RUQ pain 08/30/2016   Past Surgical History:  Procedure Laterality Date  . CHOLECYSTECTOMY N/A 02/01/2017   Procedure: LAPAROSCOPIC CHOLECYSTECTOMY;  Surgeon: Clayburn Pert, MD;  Location: [facility] ORS;  Service: General;  Laterality: N/A;  . NO PAST SURGERIES     Family History  Problem Relation Age of Onset  . Arthritis Mother   . Hyperlipidemia Mother   . Hypertension Mother   . Arthritis Father   . Hyperlipidemia Father   . Heart disease Father   . Hypertension Father   . GER disease Father   . Alcohol abuse Paternal Uncle   . Cancer Paternal Uncle   . Arthritis Maternal Grandmother   . Hyperlipidemia Maternal Grandmother   . Heart disease Maternal Grandmother   . Hypertension Maternal Grandmother   . Diabetes Maternal Grandmother   . Breast cancer Maternal Grandmother 7  . Arthritis Maternal Grandfather   . Hyperlipidemia Maternal Grandfather   . Alcohol abuse Paternal Grandmother   . Arthritis Paternal Grandmother   . Hyperlipidemia Paternal Grandmother   . Hypertension Paternal Grandmother   . Mental illness Paternal Grandmother   . Alcohol abuse Paternal Grandfather   . Arthritis Paternal Grandfather   . Hyperlipidemia Paternal Grandfather   .  Hyperlipidemia Maternal Aunt   . Hypertension Maternal Aunt   . Diabetes Maternal Aunt   . Breast cancer Maternal Aunt 31  . Deep vein thrombosis Maternal Aunt   . Cancer Maternal Uncle   . Mental illness Paternal Aunt   . Colon cancer Neg Hx     Allergies: Patient has no known allergies. Current Outpatient Medications on File Prior to Visit  Medication Sig Dispense Refill  . amLODipine (NORVASC) 5 MG tablet TAKE 1 TABLET BY MOUTH  DAILY 90 tablet 1  . calcium carbonate (TUMS - DOSED IN MG ELEMENTAL CALCIUM) 500 MG chewable tablet Chew 1 tablet by mouth as needed for indigestion or heartburn.    . fluticasone (FLONASE) 50 MCG/ACT nasal spray Place 2 sprays into both nostrils daily. (Patient not taking: Reported on 10/15/2019) 16 g 0  . ibuprofen (ADVIL,MOTRIN) 200 MG tablet Take 400 mg by mouth every 6 (six) hours as needed for mild pain.    Marland Kitchen levothyroxine (SYNTHROID) 75 MCG tablet Take 75 mcg by mouth daily.    . metFORMIN (GLUMETZA) 1000 MG (MOD) 24 hr tablet Take 1 tablet (1,000 mg total) by mouth every evening. 90 tablet 2  . Multiple Vitamins-Minerals (EQ MULTIVITAMINS ADULT GUMMY PO) Take 2 tablets by mouth daily.    . rosuvastatin (CRESTOR) 10 MG tablet TAKE 1 TABLET(10 MG) BY MOUTH DAILY 90 tablet 3   No current facility-administered  medications on file prior to visit.    Social History   Tobacco Use  . Smoking status: Never Smoker  . Smokeless tobacco: Never Used  Vaping Use  . Vaping Use: Never used  Substance Use Topics  . Alcohol use: Yes    Comment: RARE  . Drug use: No    Review of Systems  Constitutional: Negative for chills and fever.  Respiratory: Negative for cough.   Cardiovascular: Negative for chest pain and palpitations.  Gastrointestinal: Negative for nausea and vomiting.      Objective:    BP 128/78   Pulse 89   Temp 98.4 F (36.9 C)   Resp 16   Wt (!) 320 lb (145.2 kg)   SpO2 98%   BMI 47.26 kg/m  BP Readings from Last 3 Encounters:    03/08/20 128/78  09/13/18 140/90  12/05/17 (!) 162/100   Wt Readings from Last 3 Encounters:  03/08/20 (!) 320 lb (145.2 kg)  10/15/19 (!) 323 lb (146.5 kg)  08/04/19 (!) 323 lb (146.5 kg)    Physical Exam Vitals reviewed.  Constitutional:      Appearance: She is well-developed.  Eyes:     Conjunctiva/sclera: Conjunctivae normal.  Cardiovascular:     Rate and Rhythm: Normal rate and regular rhythm.     Pulses: Normal pulses.     Heart sounds: Normal heart sounds.  Pulmonary:     Effort: Pulmonary effort is normal.     Breath sounds: Normal breath sounds. No wheezing, rhonchi or rales.  Skin:    General: Skin is warm and dry.  Neurological:     Mental Status: She is alert.  Psychiatric:        Speech: Speech normal.        Behavior: Behavior normal.        Thought Content: Thought content normal.        Assessment & Plan:   Problem List Items Addressed This Visit      Cardiovascular and Mediastinum   HTN (hypertension) - Primary    Slightly elevated.  Discussed with her goal of less than 120/80 particularly in the setting of diabetes.  Patient amenable to increasing lisinopril to 10 mg.  She will repeat BMP in 7 days at [employer] (her work)      Relevant Medications   lisinopril (ZESTRIL) 10 MG tablet   Other Relevant Orders   Basic metabolic panel   Hemoglobin A1c     Endocrine   Diabetes mellitus without complication (HCC)    Suspect improved on increased dose of Metformin.  Pending A1c      Relevant Medications   lisinopril (ZESTRIL) 10 MG tablet       I have discontinued Laurie Cameron's lisinopril. I have also changed her lisinopril. Additionally, I am having her maintain her Multiple Vitamins-Minerals (EQ MULTIVITAMINS ADULT GUMMY PO), ibuprofen, calcium carbonate, fluticasone, metFORMIN, rosuvastatin, amLODipine, and levothyroxine.   Meds ordered this encounter  Medications  . DISCONTD: lisinopril (ZESTRIL) 5 MG tablet    Sig: Take 1 tablet (5 mg  total) by mouth daily.    Dispense:  90 tablet    Refill:  3    Order Specific Question:   Supervising Provider    Answer:   Duncan Dull L [[id number]]  . lisinopril (ZESTRIL) 10 MG tablet    Sig: Take 1 tablet (10 mg total) by mouth daily.    Dispense:  90 tablet    Refill:  1    Order Specific  Question:   Supervising Provider    Answer:   Sherlene Shams [[id number]]    Return precautions given.   Risks, benefits, and alternatives of the medications and treatment plan prescribed today were discussed, and patient expressed understanding.   Education regarding symptom management and diagnosis given to patient on AVS.  Continue to follow with Allegra Grana, FNP for routine health maintenance.   Ralene Ok and I agreed with plan.   [name], FNP

## 2020-03-11 ENCOUNTER — Telehealth: Payer: Self-pay

## 2020-03-11 NOTE — Telephone Encounter (Signed)
-----   Message from Allegra Grana, FNP sent at 03/08/2020  4:28 PM EDT ----- Call pt,Meant to give her pneumococcal 23 vaccine during ov; can you sch nurse visit for this?

## 2020-03-11 NOTE — Telephone Encounter (Signed)
Left message for patient. When pt calls back, please schedule her for a NV to get her Pneumovax. See note below from [place].

## 2020-03-20 ENCOUNTER — Ambulatory Visit: Payer: Managed Care, Other (non HMO) | Attending: Internal Medicine

## 2020-03-20 DIAGNOSIS — Z23 Encounter for immunization: Secondary | ICD-10-CM

## 2020-03-20 NOTE — Progress Notes (Signed)
   Covid-19 Vaccination Clinic  Name:  Laurie Cameron    MRN: [id number] DOB: 03/01/1986  03/20/2020  [name] was observed post Covid-19 immunization for 15 minutes without incident. She was provided with Vaccine Information Sheet and instruction to access the V-Safe system.   [name] was instructed to call 911 with any severe reactions post vaccine: Marland Kitchen Difficulty breathing  . Swelling of face and throat  . A fast heartbeat  . A bad rash all over body  . Dizziness and weakness   Immunizations Administered    Name Date Dose VIS Date Route   Pfizer COVID-19 Vaccine 03/20/2020  8:56 AM 0.3 mL 11/19/2018 Intramuscular   Manufacturer: [employer], [employer]   Lot: [id number]   NDC: [address]-2

## 2020-03-23 ENCOUNTER — Other Ambulatory Visit: Payer: Self-pay | Admitting: Family

## 2020-03-23 DIAGNOSIS — E119 Type 2 diabetes mellitus without complications: Secondary | ICD-10-CM

## 2020-03-23 LAB — HEMOGLOBIN A1C
Est. average glucose Bld gHb Est-mCnc: 166 mg/dL
Hgb A1c MFr Bld: 7.4 % — ABNORMAL HIGH (ref 4.8–5.6)

## 2020-03-23 LAB — BASIC METABOLIC PANEL
BUN/Creatinine Ratio: 14 (ref 9–23)
BUN: 10 mg/dL (ref 6–20)
CO2: 22 mmol/L (ref 20–29)
Calcium: 9.3 mg/dL (ref 8.7–10.2)
Chloride: 97 mmol/L (ref 96–106)
Creatinine, Ser: 0.71 mg/dL (ref 0.57–1.00)
GFR calc Af Amer: 128 mL/min/{1.73_m2} (ref >59)
GFR calc non Af Amer: 111 mL/min/{1.73_m2} (ref >59)
Glucose: 165 mg/dL — ABNORMAL HIGH (ref 65–99)
Potassium: 4.9 mmol/L (ref 3.5–5.2)
Sodium: 135 mmol/L (ref 134–144)

## 2020-03-23 MED ORDER — METFORMIN HCL ER (MOD) 1000 MG PO TB24: 1000.0000 mg | ORAL_TABLET | Freq: Two times a day (BID) | ORAL | 2 refills | Status: DC

## 2020-03-24 ENCOUNTER — Other Ambulatory Visit: Payer: Self-pay

## 2020-03-24 ENCOUNTER — Ambulatory Visit (INDEPENDENT_AMBULATORY_CARE_PROVIDER_SITE_OTHER): Payer: Managed Care, Other (non HMO)

## 2020-03-24 DIAGNOSIS — Z23 Encounter for immunization: Secondary | ICD-10-CM | POA: Diagnosis not present

## 2020-03-24 NOTE — Progress Notes (Signed)
Patient presented for pneumovax 23 injection to left deltoid, patient voiced no concerns nor showed any signs of distress during injection. 

## 2020-04-03 IMAGING — MR MR BRAIN/IAC WO/W CM
9 of 14 series · 25 of 48 positions shown · IV contrast (gadavist)
Comparison: None.

CLINICAL DATA: Dizziness and tinnitus

EXAM:
MRI HEAD WITHOUT AND WITH CONTRAST
TECHNIQUE: Multiplanar, multiecho pulse sequences of the brain and surrounding
structures were obtained without and with intravenous contrast.
CONTRAST:  10mL GADAVIST GADOBUTROL 1 MMOL/ML IV SOLN

[Series 9: T2 · axial · 5.0mm · 0.53mm/px · z∈[-90,+71]mm · 3 of 29 slices shown]
[im 1/29]
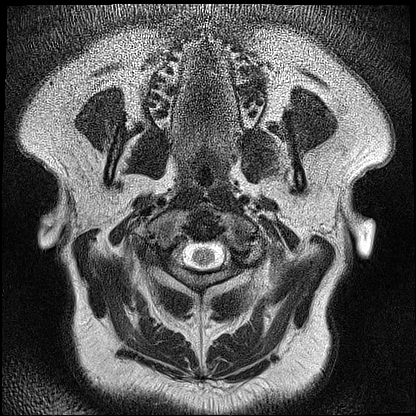
[im 15/29]
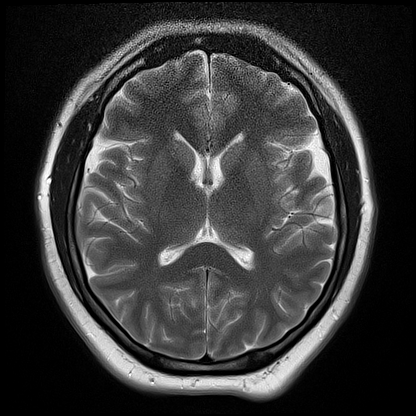
[im 29/29]
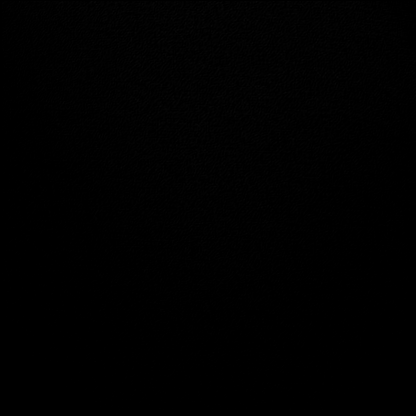

[Series 10: T1 · sagittal · 5.0mm · 0.62mm/px · 2 of 25 slices shown (1 of 3)]
[im 1/25]
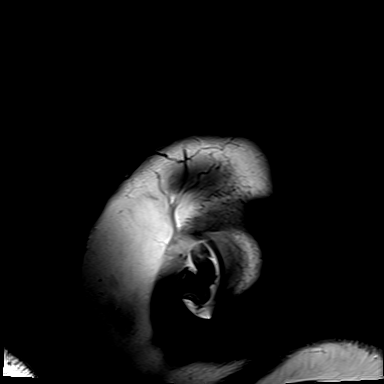
[im 25/25]
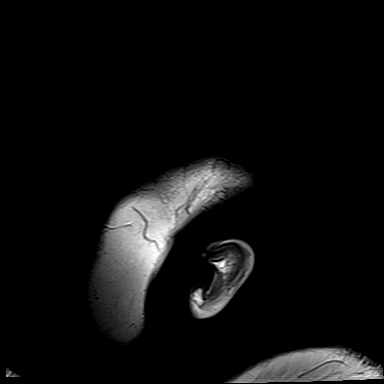

[Series 17: FLAIR · axial · 3.0mm · 0.53mm/px · z∈[-87,+69]mm · 4 of 55 slices shown (1 of 2)]
[im 1/55]
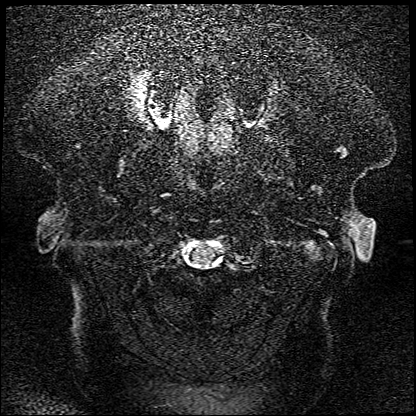
[im 19/55]
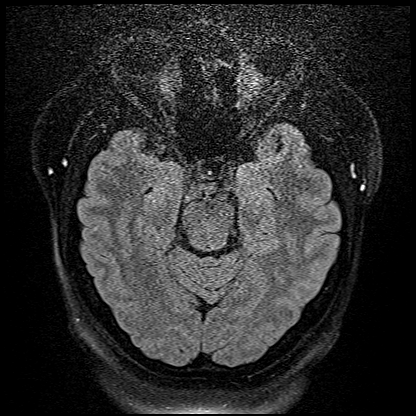
[im 37/55]
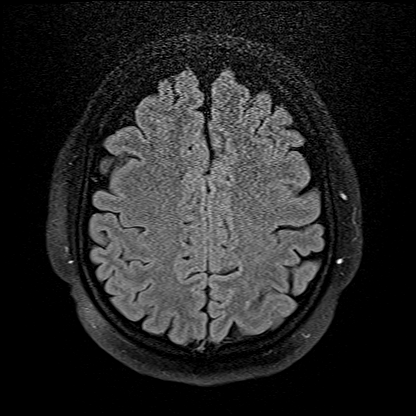
[im 55/55]
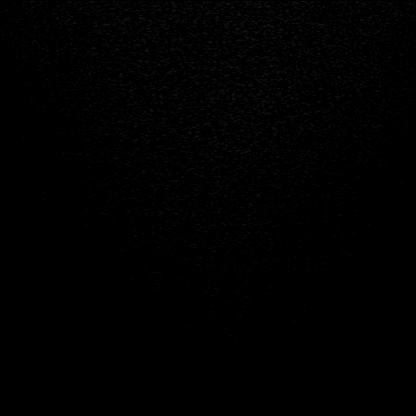

[Series 18: T1 · coronal · non-contrast · 3.0mm · 0.21mm/px · 1 of 15 slices shown (2 of 3)]
[im 1/15]
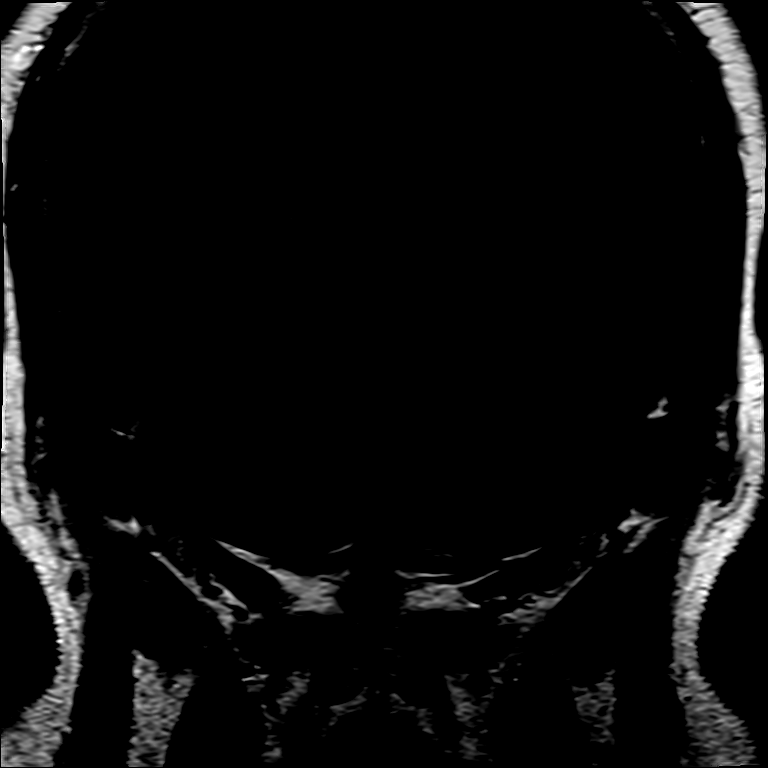

[Series 19: FLAIR · axial · 3.0mm · 0.53mm/px · z∈[-87,+69]mm · 4 of 55 slices shown (2 of 2)]
[im 1/55]
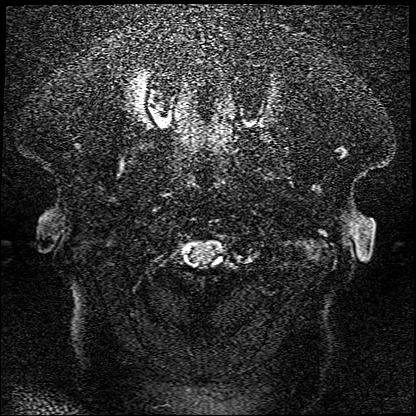
[im 19/55]
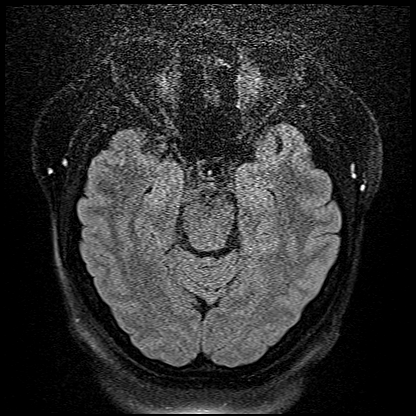
[im 37/55]
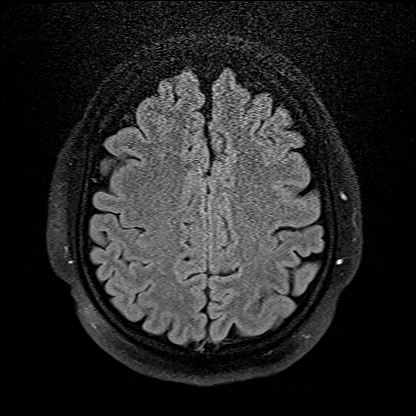
[im 55/55]
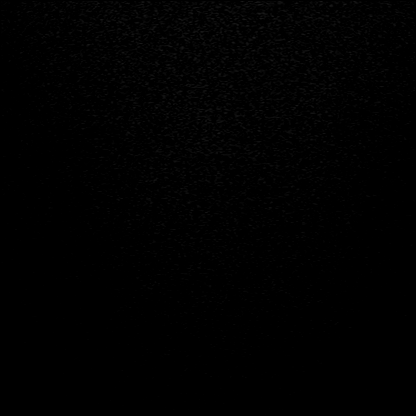

[Series 21: T1 · axial · non-contrast · 3.0mm · 0.21mm/px · 1 of 17 slices shown (3 of 3)]
[im 1/17]
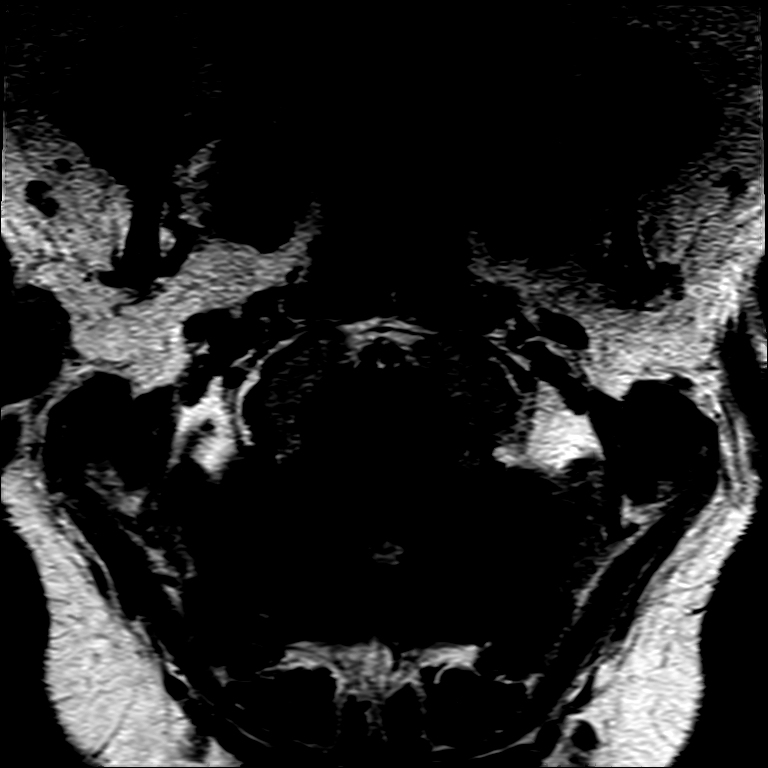

[Series 22: T1 post-contrast · axial · 3.0mm · 0.21mm/px · 1 of 17 slices shown (1 of 3)]
[im 1/17]
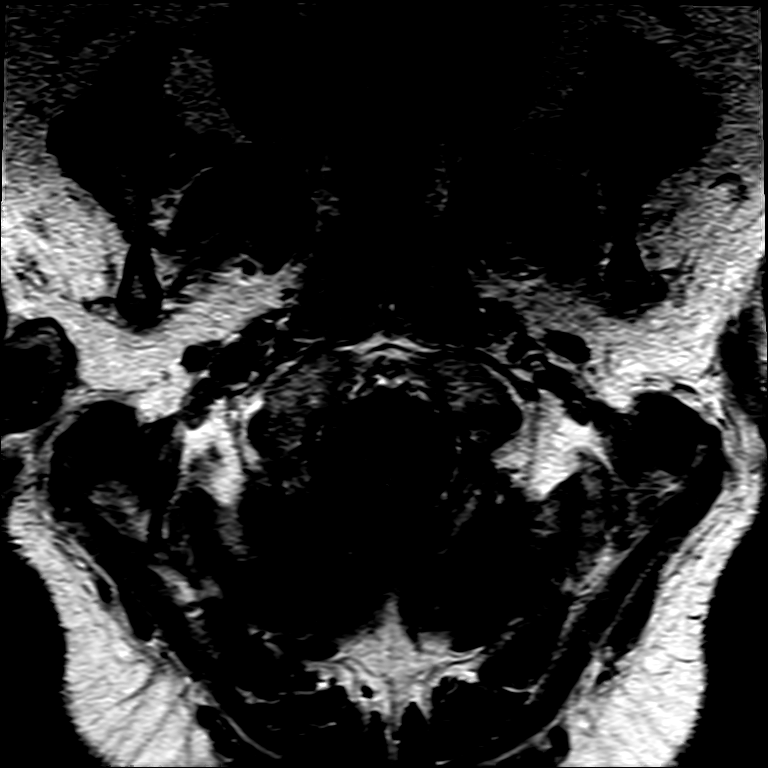

[Series 23: T1 post-contrast · coronal · 3.0mm · 0.21mm/px · 1 of 15 slices shown (2 of 3)]
[im 1/15]
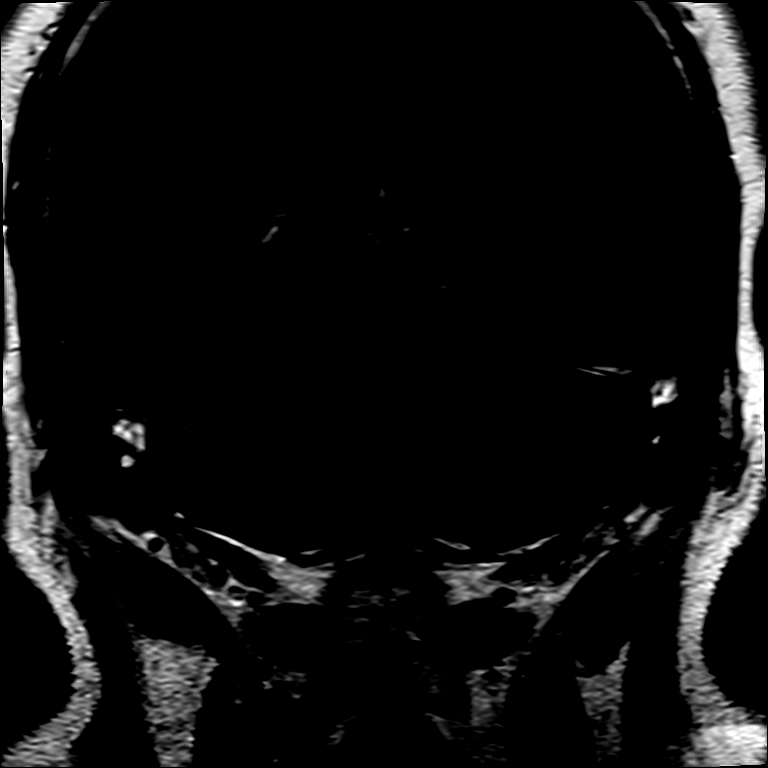

[Series 24: T1 post-contrast · axial · 1.0mm · 0.98mm/px · z∈[-90,+79]mm · 8 of 176 slices shown (3 of 3)]
[im 1/176]
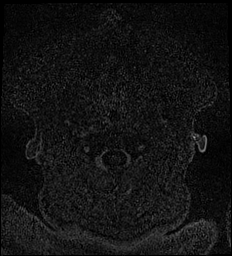
[im 32/176]
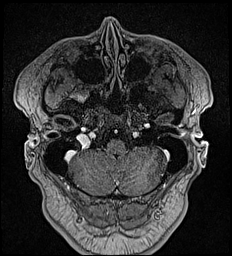
[im 48/176]
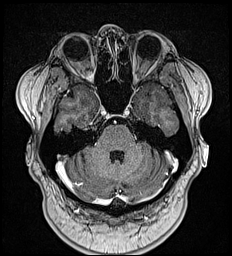
[im 80/176]
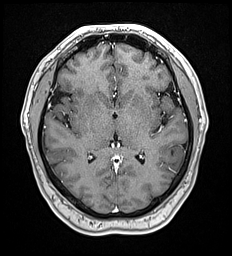
[im 96/176]
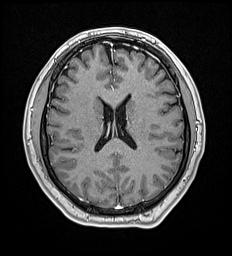
[im 128/176]
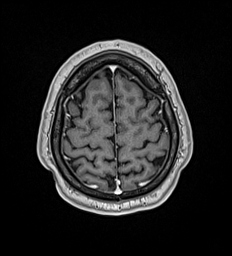
[im 144/176]
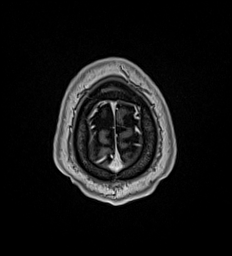
[im 176/176]
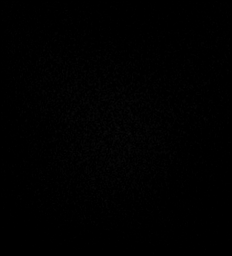

[25 of 48 positions shown; findings below may reference images not displayed]

FINDINGS: BRAIN: There is no acute infarct, acute hemorrhage or extra-axial
collection. The white matter signal is normal for the patient's age.
The cerebral and cerebellar volume are age-appropriate. There is no
hydrocephalus. The midline structures are normal.

INTERNAL AUDITORY CANALS: There is no cerebellopontine angle mass.
The cochleae and semicircular canals are normal. No focal
abnormality along the course of the 7th and 8th cranial nerves.
Normal porus acusticus and vestibular aqueduct bilaterally.

VASCULAR: The major intracranial arterial and venous sinus flow
voids are normal. Susceptibility-sensitive sequences show no chronic
microhemorrhage or superficial siderosis.

SKULL AND UPPER CERVICAL SPINE: Calvarial bone marrow signal is
normal. There is no skull base mass. The visualized upper cervical
spine and soft tissues are normal.

SINUSES/ORBITS: There are no fluid levels or advanced mucosal
thickening. The mastoid air cells and middle ear cavities are free
of fluid. The orbits are normal.
IMPRESSION: Normal MRI of the brain and internal auditory canals.

## 2020-04-10 ENCOUNTER — Other Ambulatory Visit: Payer: Self-pay

## 2020-04-10 ENCOUNTER — Ambulatory Visit: Payer: Managed Care, Other (non HMO) | Attending: Internal Medicine

## 2020-04-10 DIAGNOSIS — Z23 Encounter for immunization: Secondary | ICD-10-CM

## 2020-04-10 NOTE — Progress Notes (Signed)
   Covid-19 Vaccination Clinic  Name:  Laurie Cameron    MRN: [id number] DOB: March 15, 1986  04/10/2020  [name] was observed post Covid-19 immunization for 15 minutes without incident. She was provided with Vaccine Information Sheet and instruction to access the V-Safe system.   [name] was instructed to call 911 with any severe reactions post vaccine: Marland Kitchen Difficulty breathing  . Swelling of face and throat  . A fast heartbeat  . A bad rash all over body  . Dizziness and weakness   Immunizations Administered    Name Date Dose VIS Date Route   Pfizer COVID-19 Vaccine 04/10/2020  9:01 AM 0.3 mL 11/19/2018 Intramuscular   Manufacturer: [employer], [employer]   Lot: [id number]   NDC: [address]-4

## 2020-06-04 ENCOUNTER — Ambulatory Visit: Payer: Managed Care, Other (non HMO) | Admitting: Family

## 2020-06-06 ENCOUNTER — Other Ambulatory Visit: Payer: Self-pay | Admitting: Family

## 2020-06-06 DIAGNOSIS — E119 Type 2 diabetes mellitus without complications: Secondary | ICD-10-CM

## 2020-07-09 ENCOUNTER — Telehealth (INDEPENDENT_AMBULATORY_CARE_PROVIDER_SITE_OTHER): Payer: Managed Care, Other (non HMO) | Admitting: Family

## 2020-07-09 ENCOUNTER — Other Ambulatory Visit: Payer: Self-pay

## 2020-07-09 ENCOUNTER — Encounter: Payer: Self-pay | Admitting: Family

## 2020-07-09 VITALS — BP 129/78 | HR 89 | Ht 69.0 in | Wt 319.0 lb

## 2020-07-09 DIAGNOSIS — Z6841 Body Mass Index (BMI) 40.0 and over, adult: Secondary | ICD-10-CM

## 2020-07-09 DIAGNOSIS — I10 Essential (primary) hypertension: Secondary | ICD-10-CM | POA: Diagnosis not present

## 2020-07-09 DIAGNOSIS — E119 Type 2 diabetes mellitus without complications: Secondary | ICD-10-CM | POA: Diagnosis not present

## 2020-07-09 NOTE — Progress Notes (Signed)
Virtual Visit via Video Note  I connected with@  on 07/09/20 at  3:00 PM EDT by a video enabled telemedicine application and verified that I am speaking with the correct person using two identifiers.  Location patient: home Location provider:work  Persons participating in the virtual visit: patient, provider  I discussed the limitations of evaluation and management by telemedicine and the availability of in person appointments. The patient expressed understanding and agreed to proceed.   HPI: Feels well today Working on [employer] and decreased eating sweets.   HTN- at home 125-130/70-80. Compliant with lisinopril and amlodipine qhs.  Doesn't add salt to foods. Cooks most of food at home.   Follows with Dr Tedd Sias for hypothyroidism   ROS: See pertinent positives and negatives per HPI.    EXAM:  VITALS per patient if applicable: BP 129/78   Pulse 89   Ht 5\' 9"  (1.753 m)   Wt (!) 319 lb (144.7 kg)   BMI 47.11 kg/m  BP Readings from Last 3 Encounters:  07/09/20 129/78  03/08/20 128/78  09/13/18 140/90   Wt Readings from Last 3 Encounters:  07/09/20 (!) 319 lb (144.7 kg)  03/08/20 (!) 320 lb (145.2 kg)  10/15/19 (!) 323 lb (146.5 kg)    GENERAL: alert, oriented, appears well and in no acute distress  HEENT: atraumatic, conjunttiva clear, no obvious abnormalities on inspection of external nose and ears  NECK: normal movements of the head and neck  LUNGS: on inspection no signs of respiratory distress, breathing rate appears normal, no obvious gross SOB, gasping or wheezing  CV: no obvious cyanosis  MS: moves all visible extremities without noticeable abnormality  PSYCH/NEURO: pleasant and cooperative, no obvious depression or anxiety, speech and thought processing grossly intact  ASSESSMENT AND PLAN:  Discussed the following assessment and plan:  Problem List Items Addressed This Visit      Cardiovascular and Mediastinum   HTN (hypertension) - Primary     Slightly elevated. We had a long discussion regarding pursuing weightloss prior to increasing lisinopril. Continue lisinopril 10mg  and amlodipine 5mg       Relevant Orders   Comprehensive metabolic panel     Endocrine   Diabetes mellitus without complication (HCC)    Presumed improved. Pending a1c. Discussed starting victoza. Continue metformin 1000mg bid       Relevant Orders   Hemoglobin A1c     Other   BMI 45.0-49.9, adult (HCC)    Congratulated patient on dietary changes and weight loss this year. Advised to trial victoza for additional DM control and weight loss. She would like to consider this based on her a1c         -we discussed possible serious and likely etiologies, options for evaluation and workup, limitations of telemedicine visit vs in person visit, treatment, treatment risks and precautions. Pt prefers to treat via telemedicine empirically rather then risking or undertaking an in person visit at this moment.  .   I discussed the assessment and treatment plan with the patient. The patient was provided an opportunity to ask questions and all were answered. The patient agreed with the plan and demonstrated an understanding of the instructions.   The patient was advised to call back or seek an in-person evaluation if the symptoms worsen or if the condition fails to improve as anticipated.   , FNP

## 2020-07-09 NOTE — Assessment & Plan Note (Addendum)
Presumed improved. Pending a1c. Discussed starting victoza. Continue metformin 1000mg bid

## 2020-07-09 NOTE — Assessment & Plan Note (Signed)
Congratulated patient on dietary changes and weight loss this year. Advised to trial victoza for additional DM control and weight loss. She would like to consider this based on her a1c

## 2020-07-09 NOTE — Assessment & Plan Note (Addendum)
Slightly elevated. We had a long discussion regarding pursuing weightloss prior to increasing lisinopril. Continue lisinopril 10mg  and amlodipine 5mg 

## 2020-07-09 NOTE — Patient Instructions (Addendum)
We discussed victoza ( also called saxenda).   Let me know your thoughts and we can consider this based on a1c.   Liraglutide injection What is this medicine? LIRAGLUTIDE (LIR a GLOO tide) is used to improve blood sugar control in adults with type 2 diabetes. This medicine may be used with other diabetes medicines. This drug may also reduce the risk of heart attack or stroke if you have type 2 diabetes and risk factors for heart disease. This medicine may be used for other purposes; ask your health care provider or pharmacist if you have questions. COMMON BRAND NAME(S): Victoza What should I tell my health care provider before I take this medicine? They need to know if you have any of these conditions:  endocrine tumors (MEN 2) or if someone in your family had these tumors  gallbladder disease  high cholesterol  history of alcohol abuse problem  history of pancreatitis  kidney disease or if you are on dialysis  liver disease  previous swelling of the tongue, face, or lips with difficulty breathing, difficulty swallowing, hoarseness, or tightening of the throat  stomach problems  thyroid cancer or if someone in your family had thyroid cancer  an unusual or allergic reaction to liraglutide, other medicines, foods, dyes, or preservatives  pregnant or trying to get pregnant  breast-feeding How should I use this medicine? This medicine is for injection under the skin of your upper leg, stomach area, or upper arm. You will be taught how to prepare and give this medicine. Use exactly as directed. Take your medicine at regular intervals. Do not take it more often than directed. It is important that you put your used needles and syringes in a special sharps container. Do not put them in a trash can. If you do not have a sharps container, call your pharmacist or healthcare provider to get one A special MedGuide will be given to you by the pharmacist with each prescription and refill. Be  sure to read this information carefully each time. This drug comes with INSTRUCTIONS FOR USE. Ask your pharmacist for directions on how to use this drug. Read the information carefully. Talk to your pharmacist or health care provider if you have questions. Talk to your pediatrician regarding the use of this medicine in children. While this drug may be prescribed for children as young as 59 years of age, precautions do apply. Overdosage: If you think you have taken too much of this medicine contact a poison control center or emergency room at once. NOTE: This medicine is only for you. Do not share this medicine with others. What if I miss a dose? If you miss a dose, take it as soon as you can. If it is almost time for your next dose, take only that dose. Do not take double or extra doses. What may interact with this medicine?  other medicines for diabetes Many medications may cause changes in blood sugar, these include:  alcohol containing beverages  antiviral medicines for HIV or AIDS  aspirin and aspirin-like drugs  certain medicines for blood pressure, heart disease, irregular heart beat  chromium  diuretics  female hormones, such as estrogens or progestins, birth control pills  fenofibrate  gemfibrozil  isoniazid  lanreotide  female hormones or anabolic steroids  MAOIs like Carbex, Eldepryl, Marplan, Nardil, and Parnate  medicines for weight loss  medicines for allergies, asthma, cold, or cough  medicines for depression, anxiety, or psychotic disturbances  niacin  nicotine  NSAIDs, medicines for  pain and inflammation, like ibuprofen or naproxen  octreotide  pasireotide  pentamidine  phenytoin  probenecid  quinolone antibiotics such as ciprofloxacin, levofloxacin, ofloxacin  some herbal dietary supplements  steroid medicines such as prednisone or cortisone  sulfamethoxazole; trimethoprim  thyroid hormones Some medications can hide the warning  symptoms of low blood sugar (hypoglycemia). You may need to monitor your blood sugar more closely if you are taking one of these medications. These include:  beta-blockers, often used for high blood pressure or heart problems (examples include atenolol, metoprolol, propranolol)  clonidine  guanethidine  reserpine This list may not describe all possible interactions. Give your health care provider a list of all the medicines, herbs, non-prescription drugs, or dietary supplements you use. Also tell them if you smoke, drink alcohol, or use illegal drugs. Some items may interact with your medicine. What should I watch for while using this medicine? Visit your doctor or health care professional for regular checks on your progress. Drink plenty of fluids while taking this medicine. Check with your doctor or health care professional if you get an attack of severe diarrhea, nausea, and vomiting. The loss of too much body fluid can make it dangerous for you to take this medicine. A test called the HbA1C (A1C) will be monitored. This is a simple blood test. It measures your blood sugar control over the last 2 to 3 months. You will receive this test every 3 to 6 months. Learn how to check your blood sugar. Learn the symptoms of low and high blood sugar and how to manage them. Always carry a quick-source of sugar with you in case you have symptoms of low blood sugar. Examples include hard sugar candy or glucose tablets. Make sure others know that you can choke if you eat or drink when you develop serious symptoms of low blood sugar, such as seizures or unconsciousness. They must get medical help at once. Tell your doctor or health care professional if you have high blood sugar. You might need to change the dose of your medicine. If you are sick or exercising more than usual, you might need to change the dose of your medicine. Do not skip meals. Ask your doctor or health care professional if you should avoid  alcohol. Many nonprescription cough and cold products contain sugar or alcohol. These can affect blood sugar. Pens should never be shared. Even if the needle is changed, sharing may result in passing of viruses like hepatitis or HIV. Wear a medical ID bracelet or chain, and carry a card that describes your disease and details of your medicine and dosage times. What side effects may I notice from receiving this medicine? Side effects that you should report to your doctor or health care professional as soon as possible:  allergic reactions like skin rash, itching or hives, swelling of the face, lips, or tongue  breathing problems  diarrhea that continues or is severe  lump or swelling on the neck  severe nausea  signs and symptoms of infection like fever or chills; cough; sore throat; pain or trouble passing urine  signs and symptoms of low blood sugar such as feeling anxious, confusion, dizziness, increased hunger, unusually weak or tired, sweating, shakiness, cold, irritable, headache, blurred vision, fast heartbeat, loss of consciousness  signs and symptoms of kidney injury like trouble passing urine or change in the amount of urine  trouble swallowing  unusual stomach upset or pain  vomiting Side effects that usually do not require medical attention (report  to your doctor or health care professional if they continue or are bothersome):  constipation  decreased appetite  diarrhea  fatigue  headache  nausea  pain, redness, or irritation at site where injected  stomach upset  stuffy or runny nose This list may not describe all possible side effects. Call your doctor for medical advice about side effects. You may report side effects to FDA at 1-800-FDA-1088. Where should I keep my medicine? Keep out of the reach of children. Store unopened pen in a refrigerator between 2 and 8 degrees C (36 and 46 degrees F). Do not freeze or use if the medicine has been frozen. Protect  from light and excessive heat. After you first use the pen, it can be stored at room temperature between 15 and 30 degrees C (59 and 86 degrees F) or in a refrigerator. Throw away your used pen after 30 days or after the expiration date, whichever comes first. Do not store your pen with the needle attached. If the needle is left on, medicine may leak from the pen. NOTE: This sheet is a summary. It may not cover all possible information. If you have questions about this medicine, talk to your doctor, pharmacist, or health care provider.  2020 [employer] Standard (2019-05-27 09:39:47)

## 2020-07-10 ENCOUNTER — Other Ambulatory Visit: Payer: Self-pay | Admitting: Family

## 2020-07-10 DIAGNOSIS — R030 Elevated blood-pressure reading, without diagnosis of hypertension: Secondary | ICD-10-CM

## 2020-07-12 ENCOUNTER — Telehealth: Payer: Self-pay

## 2020-07-12 NOTE — Telephone Encounter (Signed)
lvm pt needs physical in 3 months 

## 2020-07-23 LAB — COMPREHENSIVE METABOLIC PANEL
ALT: 16 IU/L (ref 0–32)
AST: 37 IU/L (ref 0–40)
Albumin/Globulin Ratio: 1.9 (ref 1.2–2.2)
Albumin: 4.7 g/dL (ref 3.8–4.8)
Alkaline Phosphatase: 56 IU/L (ref 44–121)
BUN/Creatinine Ratio: 13 (ref 9–23)
BUN: 11 mg/dL (ref 6–20)
Bilirubin Total: 0.5 mg/dL (ref 0.0–1.2)
CO2: 22 mmol/L (ref 20–29)
Calcium: 9.6 mg/dL (ref 8.7–10.2)
Chloride: 97 mmol/L (ref 96–106)
Creatinine, Ser: 0.83 mg/dL (ref 0.57–1.00)
GFR calc Af Amer: 106 mL/min/{1.73_m2} (ref >59)
GFR calc non Af Amer: 92 mL/min/{1.73_m2} (ref >59)
Globulin, Total: 2.5 g/dL (ref 1.5–4.5)
Glucose: 154 mg/dL — ABNORMAL HIGH (ref 65–99)
Potassium: 4.9 mmol/L (ref 3.5–5.2)
Sodium: 135 mmol/L (ref 134–144)
Total Protein: 7.2 g/dL (ref 6.0–8.5)

## 2020-07-23 LAB — HEMOGLOBIN A1C
Est. average glucose Bld gHb Est-mCnc: 157 mg/dL
Hgb A1c MFr Bld: 7.1 % — ABNORMAL HIGH (ref 4.8–5.6)

## 2020-07-26 ENCOUNTER — Encounter: Payer: Self-pay | Admitting: Family

## 2020-07-27 ENCOUNTER — Encounter: Payer: Self-pay | Admitting: Family

## 2020-08-28 ENCOUNTER — Other Ambulatory Visit: Payer: Self-pay | Admitting: Family

## 2020-08-28 DIAGNOSIS — I10 Essential (primary) hypertension: Secondary | ICD-10-CM

## 2020-08-28 DIAGNOSIS — E119 Type 2 diabetes mellitus without complications: Secondary | ICD-10-CM

## 2020-09-28 LAB — HM DIABETES EYE EXAM

## 2020-10-18 ENCOUNTER — Other Ambulatory Visit: Payer: Self-pay

## 2020-10-18 ENCOUNTER — Ambulatory Visit (INDEPENDENT_AMBULATORY_CARE_PROVIDER_SITE_OTHER): Payer: Managed Care, Other (non HMO) | Admitting: Family

## 2020-10-18 ENCOUNTER — Encounter: Payer: Self-pay | Admitting: Family

## 2020-10-18 VITALS — BP 122/70 | HR 97 | Temp 98.2°F | Ht 69.0 in | Wt 317.6 lb

## 2020-10-18 DIAGNOSIS — I10 Essential (primary) hypertension: Secondary | ICD-10-CM | POA: Diagnosis not present

## 2020-10-18 DIAGNOSIS — E119 Type 2 diabetes mellitus without complications: Secondary | ICD-10-CM

## 2020-10-18 DIAGNOSIS — Z0000 Encounter for general adult medical examination without abnormal findings: Secondary | ICD-10-CM

## 2020-10-18 DIAGNOSIS — E039 Hypothyroidism, unspecified: Secondary | ICD-10-CM

## 2020-10-18 NOTE — Assessment & Plan Note (Signed)
Suspect improved in setting of weight loss. Pending a1c, continue metformin 1000mg  bid

## 2020-10-18 NOTE — Assessment & Plan Note (Signed)
Stable. Pending tsh. Continue synthroid 75 mcg.

## 2020-10-18 NOTE — Assessment & Plan Note (Signed)
Stable. continue lisinopril 10mg , amlodipine 5mg .

## 2020-10-18 NOTE — Assessment & Plan Note (Signed)
CBE and pap performed. She will have baseline mammogram , particularly with positive family history. Encouraged exercise.

## 2020-10-18 NOTE — Patient Instructions (Signed)
Please call  and schedule your 3D mammogram as discussed.   [facility]  [address]  [place], [place]  [phone]  Fasting  Labs at [facility] Maintenance, Female Adopting a healthy lifestyle and getting preventive care are important in promoting health and wellness. Ask your health care provider about:  The right schedule for you to have regular tests and exams.  Things you can do on your own to prevent diseases and keep yourself healthy. What should I know about diet, weight, and exercise? Eat a healthy diet  Eat a diet that includes plenty of vegetables, fruits, low-fat dairy products, and lean protein.  Do not eat a lot of foods that are high in solid fats, added sugars, or sodium.   Maintain a healthy weight Body mass index (BMI) is used to identify weight problems. It estimates body fat based on height and weight. Your health care provider can help determine your BMI and help you achieve or maintain a healthy weight. Get regular exercise Get regular exercise. This is one of the most important things you can do for your health. Most adults should:  Exercise for at least 150 minutes each week. The exercise should increase your heart rate and make you sweat (moderate-intensity exercise).  Do strengthening exercises at least twice a week. This is in addition to the moderate-intensity exercise.  Spend less time sitting. Even light physical activity can be beneficial. Watch cholesterol and blood lipids Have your blood tested for lipids and cholesterol at 36 years of age, then have this test every 5 years. Have your cholesterol levels checked more often if:  Your lipid or cholesterol levels are high.  You are older than 36 years of age.  You are at high risk for heart disease. What should I know about cancer screening? Depending on your health history and family history, you may need to have cancer screening at various ages. This may  include screening for:  Breast cancer.  Cervical cancer.  Colorectal cancer.  Skin cancer.  Lung cancer. What should I know about heart disease, diabetes, and high blood pressure? Blood pressure and heart disease  High blood pressure causes heart disease and increases the risk of stroke. This is more likely to develop in people who have high blood pressure readings, are of African descent, or are overweight.  Have your blood pressure checked: ? Every 3-5 years if you are 36-7 years of age. ? Every year if you are 15 years old or older. Diabetes Have regular diabetes screenings. This checks your fasting blood sugar level. Have the screening done:  Once every three years after age 55 if you are at a normal weight and have a low risk for diabetes.  More often and at a younger age if you are overweight or have a high risk for diabetes. What should I know about preventing infection? Hepatitis B If you have a higher risk for hepatitis B, you should be screened for this virus. Talk with your health care provider to find out if you are at risk for hepatitis B infection. Hepatitis C Testing is recommended for:  Everyone born from [birth year] through 1966.  Anyone with known risk factors for hepatitis C. Sexually transmitted infections (STIs)  Get screened for STIs, including gonorrhea and chlamydia, if: ? You are sexually active and are younger than 36 years of age. ? You are older than 36 years of age and your health care provider tells you that you  are at risk for this type of infection. ? Your sexual activity has changed since you were last screened, and you are at increased risk for chlamydia or gonorrhea. Ask your health care provider if you are at risk.  Ask your health care provider about whether you are at high risk for HIV. Your health care provider may recommend a prescription medicine to help prevent HIV infection. If you choose to take medicine to prevent HIV, you should first  get tested for HIV. You should then be tested every 3 months for as long as you are taking the medicine. Pregnancy  If you are about to stop having your period (premenopausal) and you may become pregnant, seek counseling before you get pregnant.  Take 400 to 800 micrograms (mcg) of folic acid every day if you become pregnant.  Ask for birth control (contraception) if you want to prevent pregnancy. Osteoporosis and menopause Osteoporosis is a disease in which the bones lose minerals and strength with aging. This can result in bone fractures. If you are 74 years old or older, or if you are at risk for osteoporosis and fractures, ask your health care provider if you should:  Be screened for bone loss.  Take a calcium or vitamin D supplement to lower your risk of fractures.  Be given hormone replacement therapy (HRT) to treat symptoms of menopause. Follow these instructions at home: Lifestyle  Do not use any products that contain nicotine or tobacco, such as cigarettes, e-cigarettes, and chewing tobacco. If you need help quitting, ask your health care provider.  Do not use street drugs.  Do not share needles.  Ask your health care provider for help if you need support or information about quitting drugs. Alcohol use  Do not drink alcohol if: ? Your health care provider tells you not to drink. ? You are pregnant, may be pregnant, or are planning to become pregnant.  If you drink alcohol: ? Limit how much you use to 0-1 drink a day. ? Limit intake if you are breastfeeding.  Be aware of how much alcohol is in your drink. In the [place] one drink equals one 12 oz bottle of beer (355 mL), one 5 oz glass of wine (148 mL), or one 1 oz glass of hard liquor (44 mL). General instructions  Schedule regular health, dental, and eye exams.  Stay current with your vaccines.  Tell your health care provider if: ? You often feel depressed. ? You have ever been abused or do not feel safe at  home. Summary  Adopting a healthy lifestyle and getting preventive care are important in promoting health and wellness.  Follow your health care provider's instructions about healthy diet, exercising, and getting tested or screened for diseases.  Follow your health care provider's instructions on monitoring your cholesterol and blood pressure. This information is not intended to replace advice given to you by your health care provider. Make sure you discuss any questions you have with your health care provider. Document Revised: 09/04/2018 Document Reviewed: 09/04/2018 Elsevier Patient Education  2021 [employer].

## 2020-10-18 NOTE — Progress Notes (Signed)
Subjective:    Patient ID: Laurie Cameron, female    DOB: May 14, 1986, 35 y.o.   MRN: [id number]  CC: Laurie Cameron is a 35 y.o. female who presents today for physical exam and follow up.    HPI: Feels well today  HTN- compliant with lisinopril 10mg , amlodipine 5mg . No cp, sob  DM- compliant with metformin 1000mg  bid. She had eye exam today, reports normal.   Hypothyroidism- compliant with synthroid 75 mcg.   HLD- compliant with crestor 10mg     Colorectal Cancer Screening: No early family history.  Breast Cancer Screening: Maternal aunt breast cancer at 32.  Cervical Cancer Screening: Due         Tetanus - utd        Pneumococcal - UTD Labs: Screening labs today. Exercise: Gets regular exercise.   Alcohol use:  rare Smoking/tobacco use: Nonsmoker.     HISTORY:  Past Medical History:  Diagnosis Date  . Asthma    AS A CHILD- NO INHALERS  . Dizziness    happened when first diagnosed with hypertension  . Elevated blood pressure reading 08/30/2016  . Frequent headaches   . GERD (gastroesophageal reflux disease)    OCC-TUMS PRN  . Hypertension   . RUQ pain 08/30/2016    Past Surgical History:  Procedure Laterality Date  . CHOLECYSTECTOMY N/A 02/01/2017   Procedure: LAPAROSCOPIC CHOLECYSTECTOMY;  Surgeon: 40, MD;  Location: [facility] ORS;  Service: General;  Laterality: N/A;  . NO PAST SURGERIES     Family History  Problem Relation Age of Onset  . Arthritis Mother   . Hyperlipidemia Mother   . Hypertension Mother   . Arthritis Father   . Hyperlipidemia Father   . Heart disease Father   . Hypertension Father   . GER disease Father   . Alcohol abuse Paternal Uncle   . Cancer Paternal Uncle   . Arthritis Maternal Grandmother   . Hyperlipidemia Maternal Grandmother   . Heart disease Maternal Grandmother   . Hypertension Maternal Grandmother   . Diabetes Maternal Grandmother   . Breast cancer Maternal Grandmother 34  . Arthritis Maternal Grandfather   .  Hyperlipidemia Maternal Grandfather   . Alcohol abuse Paternal Grandmother   . Arthritis Paternal Grandmother   . Hyperlipidemia Paternal Grandmother   . Hypertension Paternal Grandmother   . Mental illness Paternal Grandmother   . Alcohol abuse Paternal Grandfather   . Arthritis Paternal Grandfather   . Hyperlipidemia Paternal Grandfather   . Hyperlipidemia Maternal Aunt   . Hypertension Maternal Aunt   . Diabetes Maternal Aunt   . Breast cancer Maternal Aunt 40  . Deep vein thrombosis Maternal Aunt   . Cancer Maternal Uncle   . Mental illness Paternal Aunt   . Colon cancer Neg Hx   . Thyroid cancer Neg Hx       ALLERGIES: Patient has no known allergies.  Current Outpatient Medications on File Prior to Visit  Medication Sig Dispense Refill  . amLODipine (NORVASC) 5 MG tablet TAKE 1 TABLET BY MOUTH  DAILY 90 tablet 1  . calcium carbonate (TUMS - DOSED IN MG ELEMENTAL CALCIUM) 500 MG chewable tablet Chew 1 tablet by mouth as needed for indigestion or heartburn.    14/02/2016 ibuprofen (ADVIL,MOTRIN) 200 MG tablet Take 400 mg by mouth every 6 (six) hours as needed for mild pain.    04/03/2017 levothyroxine (SYNTHROID) 75 MCG tablet Take 75 mcg by mouth daily.    Ricarda Frame lisinopril (ZESTRIL) 10 MG tablet  TAKE 1 TABLET(10 MG) BY MOUTH DAILY 90 tablet 1  . metFORMIN (GLUMETZA) 1000 MG (MOD) 24 hr tablet TAKE 1 TABLET BY MOUTH  TWICE DAILY WITH MEALS 180 tablet 3  . Multiple Vitamins-Minerals (EQ MULTIVITAMINS ADULT GUMMY PO) Take 2 tablets by mouth daily.    . rosuvastatin (CRESTOR) 10 MG tablet TAKE 1 TABLET(10 MG) BY MOUTH DAILY 90 tablet 3   No current facility-administered medications on file prior to visit.    Social History   Tobacco Use  . Smoking status: Never Smoker  . Smokeless tobacco: Never Used  Vaping Use  . Vaping Use: Never used  Substance Use Topics  . Alcohol use: Yes    Comment: RARE  . Drug use: No    Review of Systems  Constitutional: Negative for chills, fever and  unexpected weight change.  HENT: Negative for congestion.   Respiratory: Negative for cough.   Cardiovascular: Negative for chest pain, palpitations and leg swelling.  Gastrointestinal: Negative for nausea and vomiting.  Musculoskeletal: Negative for arthralgias and myalgias.  Skin: Negative for rash.  Neurological: Negative for headaches.  Hematological: Negative for adenopathy.  Psychiatric/Behavioral: Negative for confusion.      Objective:    BP 122/70   Pulse 97   Temp 98.2 F (36.8 C)   Ht 5\' 9"  (1.753 m)   Wt (!) 317 lb 9.6 oz (144.1 kg)   SpO2 98%   BMI 46.90 kg/m   BP Readings from Last 3 Encounters:  10/18/20 122/70  07/09/20 129/78  03/08/20 128/78   Wt Readings from Last 3 Encounters:  10/18/20 (!) 317 lb 9.6 oz (144.1 kg)  07/09/20 (!) 319 lb (144.7 kg)  03/08/20 (!) 320 lb (145.2 kg)    Physical Exam Vitals reviewed.  Constitutional:      Appearance: She is well-developed and well-nourished.  Eyes:     Conjunctiva/sclera: Conjunctivae normal.  Neck:     Thyroid: No thyroid mass or thyromegaly.  Cardiovascular:     Rate and Rhythm: Normal rate and regular rhythm.     Pulses: Normal pulses.     Heart sounds: Normal heart sounds.  Pulmonary:     Effort: Pulmonary effort is normal.     Breath sounds: Normal breath sounds. No wheezing, rhonchi or rales.     Comments: No masses or asymmetry appreciated during CBE. Chest:  Breasts: Breasts are symmetrical.     Right: No inverted nipple, mass, nipple discharge, skin change or tenderness.     Left: No inverted nipple, mass, nipple discharge, skin change or tenderness.    Genitourinary:    Cervix: No cervical motion tenderness, discharge or friability.     Uterus: Not enlarged, not fixed and not tender.      Adnexa:        Right: No mass, tenderness or fullness.         Left: No mass, tenderness or fullness.       Comments: Pap performed. No CMT. Unable to appreciated ovaries. Lymphadenopathy:      Head:     Right side of head: No submental, submandibular, tonsillar, preauricular, posterior auricular or occipital adenopathy.     Left side of head: No submental, submandibular, tonsillar, preauricular, posterior auricular or occipital adenopathy.     Cervical:     Right cervical: No superficial, deep or posterior cervical adenopathy.    Left cervical: No superficial, deep or posterior cervical adenopathy.     Upper Body:  No axillary adenopathy present.  Right upper body: No pectoral or lateral adenopathy.     Left upper body: No pectoral or lateral adenopathy.  Skin:    General: Skin is warm and dry.  Neurological:     Mental Status: She is alert.  Psychiatric:        Mood and Affect: Mood and affect normal.        Speech: Speech normal.        Behavior: Behavior normal.        Thought Content: Thought content normal.        Assessment & Plan:   Problem List Items Addressed This Visit      Cardiovascular and Mediastinum   HTN (hypertension)    Stable. continue lisinopril 10mg , amlodipine 5mg .        Endocrine   Diabetes mellitus without complication (HCC)    Suspect improved in setting of weight loss. Pending a1c, continue metformin 1000mg  bid      Hypothyroidism    Stable. Pending tsh. Continue synthroid 75 mcg.          Other   Routine physical examination - Primary    CBE and pap performed. She will have baseline mammogram , particularly with positive family history. Encouraged exercise.       Relevant Orders   TSH   CBC with Differential/Platelet   Comprehensive metabolic panel   Hemoglobin A1c   Lipid panel   Microalbumin / creatinine urine ratio   VITAMIN D 25 Hydroxy (Vit-D Deficiency, Fractures)   MM 3D SCREEN BREAST BILATERAL   IGP, Aptima HPV       I am having Zully Frane maintain her Multiple Vitamins-Minerals (EQ MULTIVITAMINS ADULT GUMMY PO), ibuprofen, calcium carbonate, rosuvastatin, levothyroxine, metFORMIN, amLODipine, and  lisinopril.   No orders of the defined types were placed in this encounter.   Return precautions given.   Risks, benefits, and alternatives of the medications and treatment plan prescribed today were discussed, and patient expressed understanding.   Education regarding symptom management and diagnosis given to patient on AVS.   Continue to follow with , FNP for routine health maintenance.   and I agreed with plan.   Ralene Ok, FNP

## 2020-10-20 LAB — IGP, APTIMA HPV: HPV Aptima: NEGATIVE

## 2020-10-25 ENCOUNTER — Telehealth: Payer: Self-pay | Admitting: Family

## 2020-10-25 NOTE — Telephone Encounter (Signed)
lft vm for pt to call ofc regarding mammo

## 2020-11-05 ENCOUNTER — Other Ambulatory Visit: Payer: Self-pay | Admitting: Family

## 2020-11-05 DIAGNOSIS — E119 Type 2 diabetes mellitus without complications: Secondary | ICD-10-CM

## 2020-11-10 ENCOUNTER — Encounter: Payer: Self-pay | Admitting: Family

## 2020-11-10 ENCOUNTER — Other Ambulatory Visit: Payer: Self-pay | Admitting: Family

## 2020-11-10 DIAGNOSIS — E039 Hypothyroidism, unspecified: Secondary | ICD-10-CM

## 2020-11-10 LAB — CBC WITH DIFFERENTIAL/PLATELET
Basophils Absolute: 0.1 10*3/uL (ref 0.0–0.2)
Basos: 1 %
EOS (ABSOLUTE): 0.3 10*3/uL (ref 0.0–0.4)
Eos: 3 %
Hematocrit: 40.6 % (ref 34.0–46.6)
Hemoglobin: 13.4 g/dL (ref 11.1–15.9)
Immature Grans (Abs): 0 10*3/uL (ref 0.0–0.1)
Immature Granulocytes: 0 %
Lymphocytes Absolute: 2.9 10*3/uL (ref 0.7–3.1)
Lymphs: 29 %
MCH: 28.4 pg (ref 26.6–33.0)
MCHC: 33 g/dL (ref 31.5–35.7)
MCV: 86 fL (ref 79–97)
Monocytes Absolute: 0.6 10*3/uL (ref 0.1–0.9)
Monocytes: 6 %
Neutrophils Absolute: 6.1 10*3/uL (ref 1.4–7.0)
Neutrophils: 61 %
Platelets: 289 10*3/uL (ref 150–450)
RBC: 4.72 x10E6/uL (ref 3.77–5.28)
RDW: 12.3 % (ref 11.7–15.4)
WBC: 10 10*3/uL (ref 3.4–10.8)

## 2020-11-10 LAB — HEMOGLOBIN A1C
Est. average glucose Bld gHb Est-mCnc: 174 mg/dL
Hgb A1c MFr Bld: 7.7 % — ABNORMAL HIGH (ref 4.8–5.6)

## 2020-11-10 LAB — COMPREHENSIVE METABOLIC PANEL
ALT: 21 IU/L (ref 0–32)
AST: 45 IU/L — ABNORMAL HIGH (ref 0–40)
Albumin/Globulin Ratio: 1.8 (ref 1.2–2.2)
Albumin: 4.6 g/dL (ref 3.8–4.8)
Alkaline Phosphatase: 55 IU/L (ref 44–121)
BUN/Creatinine Ratio: 14 (ref 9–23)
BUN: 10 mg/dL (ref 6–20)
Bilirubin Total: 0.4 mg/dL (ref 0.0–1.2)
CO2: 20 mmol/L (ref 20–29)
Calcium: 9.2 mg/dL (ref 8.7–10.2)
Chloride: 97 mmol/L (ref 96–106)
Creatinine, Ser: 0.72 mg/dL (ref 0.57–1.00)
GFR calc Af Amer: 125 mL/min/{1.73_m2} (ref >59)
GFR calc non Af Amer: 109 mL/min/{1.73_m2} (ref >59)
Globulin, Total: 2.6 g/dL (ref 1.5–4.5)
Glucose: 194 mg/dL — ABNORMAL HIGH (ref 65–99)
Potassium: 4.6 mmol/L (ref 3.5–5.2)
Sodium: 136 mmol/L (ref 134–144)
Total Protein: 7.2 g/dL (ref 6.0–8.5)

## 2020-11-10 LAB — LIPID PANEL
Chol/HDL Ratio: 2.7 ratio (ref 0.0–4.4)
Cholesterol, Total: 151 mg/dL (ref 100–199)
HDL: 55 mg/dL (ref >39)
LDL Chol Calc (NIH): 62 mg/dL (ref 0–99)
Triglycerides: 208 mg/dL — ABNORMAL HIGH (ref 0–149)
VLDL Cholesterol Cal: 34 mg/dL (ref 5–40)

## 2020-11-10 LAB — MICROALBUMIN / CREATININE URINE RATIO
Creatinine, Urine: 159.1 mg/dL
Microalb/Creat Ratio: 60 mg/g creat — ABNORMAL HIGH (ref 0–29)
Microalbumin, Urine: 95.9 ug/mL

## 2020-11-10 LAB — VITAMIN D 25 HYDROXY (VIT D DEFICIENCY, FRACTURES): Vit D, 25-Hydroxy: 31.8 ng/mL (ref 30.0–100.0)

## 2020-11-10 LAB — TSH: TSH: 5.66 u[IU]/mL — ABNORMAL HIGH (ref 0.450–4.500)

## 2020-11-10 MED ORDER — LEVOTHYROXINE SODIUM 88 MCG PO TABS: 88.0000 ug | ORAL_TABLET | Freq: Every day | ORAL | 1 refills | Status: DC

## 2020-11-11 NOTE — Progress Notes (Signed)
Patient has seen results and responded via my chart.

## 2020-12-17 ENCOUNTER — Encounter: Payer: Self-pay | Admitting: Family

## 2020-12-17 ENCOUNTER — Ambulatory Visit: Payer: Managed Care, Other (non HMO) | Admitting: Family

## 2020-12-17 ENCOUNTER — Other Ambulatory Visit: Payer: Self-pay

## 2020-12-17 DIAGNOSIS — I10 Essential (primary) hypertension: Secondary | ICD-10-CM | POA: Diagnosis not present

## 2020-12-17 DIAGNOSIS — E119 Type 2 diabetes mellitus without complications: Secondary | ICD-10-CM

## 2020-12-17 DIAGNOSIS — E039 Hypothyroidism, unspecified: Secondary | ICD-10-CM | POA: Diagnosis not present

## 2020-12-17 DIAGNOSIS — E785 Hyperlipidemia, unspecified: Secondary | ICD-10-CM | POA: Diagnosis not present

## 2020-12-17 MED ORDER — LISINOPRIL 10 MG PO TABS: 15.0000 mg | ORAL_TABLET | Freq: Every day | ORAL | 1 refills | Status: DC

## 2020-12-17 NOTE — Assessment & Plan Note (Signed)
Compliant with synthroid. She follows with endocrine.

## 2020-12-17 NOTE — Progress Notes (Signed)
Subjective:    Patient ID: Laurie Cameron, female    DOB: Apr 19, 1986, 35 y.o.   MRN: [id number]  CC: Laurie Cameron is a 35 y.o. female who presents today for follow up.   HPI:   Feels well today No new concerns.   Hypothyroidism- compliant with synthroid . Follow up with Dr Tedd Sias next month. h/o hypothyroidisim due to  hashimoto's thyroiditis. No h/o  Thyroid nodules.   DM- compliant with metformin 1000mg  BID.   No personal or family h/o thyroid cancer.   HLD- compliant with lipitor 10mg   HTN- compliant with lisionpril 10mg . No cp, sob.     HISTORY:  Past Medical History:  Diagnosis Date  . Asthma    AS A CHILD- NO INHALERS  . Dizziness    happened when first diagnosed with hypertension  . Elevated blood pressure reading 08/30/2016  . Frequent headaches   . GERD (gastroesophageal reflux disease)    OCC-TUMS PRN  . Hypertension   . RUQ pain 08/30/2016   Past Surgical History:  Procedure Laterality Date  . CHOLECYSTECTOMY N/A 02/01/2017   Procedure: LAPAROSCOPIC CHOLECYSTECTOMY;  Surgeon: 14/02/2016, MD;  Location: [facility] ORS;  Service: General;  Laterality: N/A;  . NO PAST SURGERIES     Family History  Problem Relation Age of Onset  . Arthritis Mother   . Hyperlipidemia Mother   . Hypertension Mother   . Arthritis Father   . Hyperlipidemia Father   . Heart disease Father   . Hypertension Father   . GER disease Father   . Alcohol abuse Paternal Uncle   . Cancer Paternal Uncle   . Arthritis Maternal Grandmother   . Hyperlipidemia Maternal Grandmother   . Heart disease Maternal Grandmother   . Hypertension Maternal Grandmother   . Diabetes Maternal Grandmother   . Breast cancer Maternal Grandmother 18  . Arthritis Maternal Grandfather   . Hyperlipidemia Maternal Grandfather   . Alcohol abuse Paternal Grandmother   . Arthritis Paternal Grandmother   . Hyperlipidemia Paternal Grandmother   . Hypertension Paternal Grandmother   . Mental illness  Paternal Grandmother   . Alcohol abuse Paternal Grandfather   . Arthritis Paternal Grandfather   . Hyperlipidemia Paternal Grandfather   . Hyperlipidemia Maternal Aunt   . Hypertension Maternal Aunt   . Diabetes Maternal Aunt   . Breast cancer Maternal Aunt 40  . Deep vein thrombosis Maternal Aunt   . Cancer Maternal Uncle   . Mental illness Paternal Aunt   . Colon cancer Neg Hx   . Thyroid cancer Neg Hx     Allergies: Patient has no known allergies. Current Outpatient Medications on File Prior to Visit  Medication Sig Dispense Refill  . amLODipine (NORVASC) 5 MG tablet TAKE 1 TABLET BY MOUTH  DAILY 90 tablet 1  . calcium carbonate (TUMS - DOSED IN MG ELEMENTAL CALCIUM) 500 MG chewable tablet Chew 1 tablet by mouth as needed for indigestion or heartburn.    04/03/2017 ibuprofen (ADVIL,MOTRIN) 200 MG tablet Take 400 mg by mouth every 6 (six) hours as needed for mild pain.    Ricarda Frame levothyroxine (SYNTHROID) 88 MCG tablet Take 1 tablet (88 mcg total) by mouth daily. 90 tablet 1  . metFORMIN (GLUMETZA) 1000 MG (MOD) 24 hr tablet TAKE 1 TABLET BY MOUTH  TWICE DAILY WITH MEALS 180 tablet 3  . Multiple Vitamins-Minerals (EQ MULTIVITAMINS ADULT GUMMY PO) Take 2 tablets by mouth daily.    . rosuvastatin (CRESTOR) 10 MG tablet TAKE 1 TABLET(10  MG) BY MOUTH DAILY 90 tablet 3   No current facility-administered medications on file prior to visit.    Social History   Tobacco Use  . Smoking status: Never Smoker  . Smokeless tobacco: Never Used  Vaping Use  . Vaping Use: Never used  Substance Use Topics  . Alcohol use: Yes    Comment: RARE  . Drug use: No    Review of Systems  Constitutional: Negative for chills and fever.  Respiratory: Negative for cough.   Cardiovascular: Negative for chest pain and palpitations.  Gastrointestinal: Negative for nausea and vomiting.      Objective:    BP 124/80   Pulse 94   Temp (!) 97.5 F (36.4 C)   Ht 5' 9.02" (1.753 m)   Wt (!) 320 lb 3.2 oz (145.2  kg)   SpO2 97%   BMI 47.26 kg/m  BP Readings from Last 3 Encounters:  12/17/20 124/80  10/18/20 122/70  07/09/20 129/78   Wt Readings from Last 3 Encounters:  12/17/20 (!) 320 lb 3.2 oz (145.2 kg)  10/18/20 (!) 317 lb 9.6 oz (144.1 kg)  07/09/20 (!) 319 lb (144.7 kg)    Physical Exam Vitals reviewed.  Constitutional:      Appearance: She is well-developed.  Eyes:     Conjunctiva/sclera: Conjunctivae normal.  Neck:     Thyroid: No thyroid mass, thyromegaly or thyroid tenderness.  Cardiovascular:     Rate and Rhythm: Normal rate and regular rhythm.     Pulses: Normal pulses.     Heart sounds: Normal heart sounds.  Pulmonary:     Effort: Pulmonary effort is normal.     Breath sounds: Normal breath sounds. No wheezing, rhonchi or rales.  Skin:    General: Skin is warm and dry.  Neurological:     Mental Status: She is alert.  Psychiatric:        Speech: Speech normal.        Behavior: Behavior normal.        Thought Content: Thought content normal.        Assessment & Plan:   Problem List Items Addressed This Visit      Cardiovascular and Mediastinum   HTN (hypertension)    Controlled. However concerned with proteinuria. Increase lisinopril to 15mg  qd and recheck urine in a 3 months time.       Relevant Medications   lisinopril (ZESTRIL) 10 MG tablet   Other Relevant Orders   Basic metabolic panel     Endocrine   Diabetes mellitus without complication (HCC)    Lab Results  Component Value Date   HGBA1C 7.7 (H) 11/09/2020   Uncontrolled. Continue metformin 1000mg  bid. Would like to start Rybelsus 3mg . As patient is following with Dr 11/11/2020, will call to Dr to ensure she is okay with starting in setting of thyroid disease.       Relevant Medications   lisinopril (ZESTRIL) 10 MG tablet   Hypothyroidism    Compliant with synthroid. She follows with endocrine.         Other   HLD (hyperlipidemia)    Controlled. Continue crestor 10mg        Relevant Medications   lisinopril (ZESTRIL) 10 MG tablet       I have changed Laurie Cameron's lisinopril. I am also having her maintain her Multiple Vitamins-Minerals (EQ MULTIVITAMINS ADULT GUMMY PO), ibuprofen, calcium carbonate, metFORMIN, amLODipine, rosuvastatin, and levothyroxine.   Meds ordered this encounter  Medications  . lisinopril (  ZESTRIL) 10 MG tablet    Sig: Take 1.5 tablets (15 mg total) by mouth daily.    Dispense:  90 tablet    Refill:  1    Order Specific Question:   Supervising Provider    Answer:   Sherlene Shams [[id number]]    Return precautions given.   Risks, benefits, and alternatives of the medications and treatment plan prescribed today were discussed, and patient expressed understanding.   Education regarding symptom management and diagnosis given to patient on AVS.  Continue to follow with Allegra Grana, FNP for routine health maintenance.   Ralene Ok and I agreed with plan.   [name], FNP

## 2020-12-17 NOTE — Assessment & Plan Note (Signed)
Lab Results  Component Value Date   HGBA1C 7.7 (H) 11/09/2020   Uncontrolled. Continue metformin 1000mg  bid. Would like to start Rybelsus 3mg . As patient is following with Dr , will call to Dr to ensure she is okay with starting in setting of thyroid disease.

## 2020-12-17 NOTE — Assessment & Plan Note (Signed)
Controlled. Continue crestor 10mg 

## 2020-12-17 NOTE — Assessment & Plan Note (Signed)
Controlled. However concerned with proteinuria. Increase lisinopril to 15mg  qd and recheck urine in a 3 months time.

## 2020-12-27 ENCOUNTER — Other Ambulatory Visit: Payer: Self-pay | Admitting: Family

## 2020-12-27 DIAGNOSIS — Z1231 Encounter for screening mammogram for malignant neoplasm of breast: Secondary | ICD-10-CM

## 2020-12-28 NOTE — Progress Notes (Signed)
LM for Dr. Pricilla Handler nurse/CMA to call me back to advise on thoughts.

## 2020-12-29 ENCOUNTER — Other Ambulatory Visit: Payer: Self-pay | Admitting: Family

## 2020-12-29 ENCOUNTER — Telehealth: Payer: Self-pay | Admitting: Family

## 2020-12-29 DIAGNOSIS — R030 Elevated blood-pressure reading, without diagnosis of hypertension: Secondary | ICD-10-CM

## 2020-12-29 DIAGNOSIS — E119 Type 2 diabetes mellitus without complications: Secondary | ICD-10-CM

## 2020-12-29 MED ORDER — RYBELSUS 3 MG PO TABS: 3.0000 mg | ORAL_TABLET | Freq: Every day | ORAL | 0 refills | Status: DC

## 2020-12-29 NOTE — Telephone Encounter (Signed)
-----   Message from ELANDA GARMANY, CMA sent at 12/29/2020 11:54 AM EDT -----   ----- Message ----- From: Allegra Grana, FNP Sent: 12/17/2020   5:01 PM EDT To: Marijo Sanes, CMA  Call endocrine Dr Danie Binder) [phone] Please ask if she has any concerns in setting of hypothyroidism of starting rybelsus 3mg . I dont presume she would however wanted to double check .

## 2020-12-29 NOTE — Telephone Encounter (Signed)
LMTCB

## 2020-12-29 NOTE — Progress Notes (Signed)
Dr. Pricilla Handler nurse returned my call. There are no concerns with patient taking the Rybelsus.

## 2020-12-29 NOTE — Telephone Encounter (Signed)
Call pt Dr Tedd Sias in agreement with starting rybelsus I have sent 3mg  After one month, ask her to call the office and I will send in rybelsus 7mg 

## 2020-12-30 NOTE — Telephone Encounter (Signed)
I have spoken with patient. She will let us know after taking Rybelsus for one month of the 3mg  tablet when she is going to run out so we can sent the 7mg  dose. She also needed PA for [facility], which I have done. May need to be switched however to generic if not approved. Will follow PA on portal.

## 2021-01-17 ENCOUNTER — Ambulatory Visit: Payer: Managed Care, Other (non HMO) | Admitting: Family

## 2021-01-24 ENCOUNTER — Encounter: Payer: Self-pay | Admitting: Family

## 2021-01-24 ENCOUNTER — Other Ambulatory Visit: Payer: Self-pay

## 2021-01-24 MED ORDER — RYBELSUS 7 MG PO TABS: 7.0000 mg | ORAL_TABLET | Freq: Every day | ORAL | 1 refills | Status: DC

## 2021-01-24 NOTE — Telephone Encounter (Signed)
Please advise, does dose need to be changed? Last sent 12/29/20

## 2021-02-18 DIAGNOSIS — Z1231 Encounter for screening mammogram for malignant neoplasm of breast: Secondary | ICD-10-CM

## 2021-04-13 ENCOUNTER — Telehealth (INDEPENDENT_AMBULATORY_CARE_PROVIDER_SITE_OTHER): Payer: Managed Care, Other (non HMO) | Admitting: Family

## 2021-04-13 ENCOUNTER — Encounter: Payer: Self-pay | Admitting: Family

## 2021-04-13 VITALS — Ht 69.02 in

## 2021-04-13 DIAGNOSIS — E785 Hyperlipidemia, unspecified: Secondary | ICD-10-CM | POA: Diagnosis not present

## 2021-04-13 DIAGNOSIS — E119 Type 2 diabetes mellitus without complications: Secondary | ICD-10-CM

## 2021-04-13 DIAGNOSIS — I10 Essential (primary) hypertension: Secondary | ICD-10-CM | POA: Diagnosis not present

## 2021-04-13 NOTE — Assessment & Plan Note (Signed)
Presumed controlled.  Advised patient to spot check BP at home as it may  that we can discontinue amlodipine in setting of weight loss. Continue amlodipine 5mg , lisinopril 15mg 

## 2021-04-13 NOTE — Assessment & Plan Note (Signed)
Pending A1c.  Congratulated patient on very significant weight loss.  Continue metformin 1000mg  twice a day, rybelsus 7mg 

## 2021-04-13 NOTE — Patient Instructions (Addendum)
It is imperative that you are seen AT least twice per year for labs and monitoring. Monitor blood pressure at home and me 5-6 reading on separate days. Goal is less than 120/80, based on newest guidelines, however we certainly want to be less than 130/80;  if persistently higher, please make sooner follow up appointment so we can recheck you blood pressure and manage/ adjust medications.  Please call  and schedule your 3D mammogram as discussed.   [facility]  [address]  [place], [place]  [phone]

## 2021-04-13 NOTE — Progress Notes (Signed)
Virtual Visit via Video Note  I connected with@  on 04/13/21 at  4:00 PM EDT by a video enabled telemedicine application and verified that I am speaking with the correct person using two identifiers.  Location patient: home Location provider:work  Persons participating in the virtual visit: patient, provider  I discussed the limitations of evaluation and management by telemedicine and the availability of in person appointments. The patient expressed understanding and agreed to proceed.   HPI: Feels well today No complaints  She has lost weight and is very pleased with this. She is eating healthier and eating less portions.   Hypertension- compliant with amlodipine 5mg , lisinopril 15mg . She doesn't check blood pressure at home. No cp, dizziness.   DM-compliant metformin 1000mg  twice a day, rybelsus 7mg   HLD- compliant with crestor 10mg   Hypothyroidism- she is compliant with synthroid  75 MCG; she follows with Dr  ROS: See pertinent positives and negatives per HPI.    EXAM:  VITALS per patient if applicable: Ht 5' 9.02" (1.753 m)   BMI 47.26 kg/m  BP Readings from Last 3 Encounters:  12/17/20 124/80  10/18/20 122/70  07/09/20 129/78   Wt Readings from Last 3 Encounters:  12/17/20 (!) 320 lb 3.2 oz (145.2 kg)  10/18/20 (!) 317 lb 9.6 oz (144.1 kg)  07/09/20 (!) 319 lb (144.7 kg)    GENERAL: alert, oriented, appears well and in no acute distress  HEENT: atraumatic, conjunttiva clear, no obvious abnormalities on inspection of external nose and ears  NECK: normal movements of the head and neck  LUNGS: on inspection no signs of respiratory distress, breathing rate appears normal, no obvious gross SOB, gasping or wheezing  CV: no obvious cyanosis  MS: moves all visible extremities without noticeable abnormality  PSYCH/NEURO: pleasant and cooperative, no obvious depression or anxiety, speech and thought processing grossly intact  ASSESSMENT AND  PLAN:  Discussed the following assessment and plan:  Problem List Items Addressed This Visit       Cardiovascular and Mediastinum   HTN (hypertension)    Presumed controlled.  Advised patient to spot check BP at home as it may  that we can discontinue amlodipine in setting of weight loss. Continue amlodipine 5mg , lisinopril 15mg          Endocrine   Diabetes mellitus without complication (HCC) - Primary    Pending A1c.  Congratulated patient on very significant weight loss.  Continue metformin 1000mg  twice a day, rybelsus 7mg        Relevant Orders   Microalbumin / creatinine urine ratio   Hemoglobin A1c   Comprehensive metabolic panel     Other   HLD (hyperlipidemia)    Chronic, stable, continue Crestor 10 mg      Patient will schedule baseline mammogram  -we discussed possible serious and likely etiologies, options for evaluation and workup, limitations of telemedicine visit vs in person visit, treatment, treatment risks and precautions. Pt prefers to treat via telemedicine empirically rather then risking or undertaking an in person visit at this moment.  .   I discussed the assessment and treatment plan with the patient. The patient was provided an opportunity to ask questions and all were answered. The patient agreed with the plan and demonstrated an understanding of the instructions.   The patient was advised to call back or seek an in-person evaluation if the symptoms worsen or if the condition fails to improve as anticipated.   10/20/20, FNP

## 2021-04-13 NOTE — Assessment & Plan Note (Signed)
Chronic, stable, continue Crestor 10 mg

## 2021-04-18 ENCOUNTER — Encounter: Payer: Self-pay | Admitting: Family

## 2021-05-19 ENCOUNTER — Other Ambulatory Visit: Payer: Self-pay | Admitting: Family

## 2021-05-19 DIAGNOSIS — R030 Elevated blood-pressure reading, without diagnosis of hypertension: Secondary | ICD-10-CM

## 2021-05-19 DIAGNOSIS — E119 Type 2 diabetes mellitus without complications: Secondary | ICD-10-CM

## 2021-07-16 ENCOUNTER — Other Ambulatory Visit: Payer: Self-pay | Admitting: Family

## 2021-07-16 DIAGNOSIS — I10 Essential (primary) hypertension: Secondary | ICD-10-CM

## 2021-07-16 DIAGNOSIS — E119 Type 2 diabetes mellitus without complications: Secondary | ICD-10-CM

## 2021-08-12 DIAGNOSIS — Z1231 Encounter for screening mammogram for malignant neoplasm of breast: Secondary | ICD-10-CM

## 2021-09-06 ENCOUNTER — Encounter: Payer: Self-pay | Admitting: Family

## 2021-10-07 ENCOUNTER — Ambulatory Visit: Payer: Managed Care, Other (non HMO) | Admitting: Family

## 2021-10-07 ENCOUNTER — Encounter: Payer: Self-pay | Admitting: Family

## 2021-10-07 ENCOUNTER — Other Ambulatory Visit: Payer: Self-pay | Admitting: Family

## 2021-10-07 ENCOUNTER — Other Ambulatory Visit: Payer: Self-pay

## 2021-10-07 VITALS — BP 128/74 | HR 94 | Temp 98.4°F | Ht 69.0 in | Wt 305.4 lb

## 2021-10-07 DIAGNOSIS — I10 Essential (primary) hypertension: Secondary | ICD-10-CM | POA: Diagnosis not present

## 2021-10-07 DIAGNOSIS — E119 Type 2 diabetes mellitus without complications: Secondary | ICD-10-CM

## 2021-10-07 DIAGNOSIS — E785 Hyperlipidemia, unspecified: Secondary | ICD-10-CM | POA: Diagnosis not present

## 2021-10-07 DIAGNOSIS — M6740 Ganglion, unspecified site: Secondary | ICD-10-CM

## 2021-10-07 LAB — POCT GLYCOSYLATED HEMOGLOBIN (HGB A1C): Hemoglobin A1C: 5.9 % — AB (ref 4.0–5.6)

## 2021-10-07 NOTE — Assessment & Plan Note (Signed)
Lab Results  Component Value Date   HGBA1C 5.9 (A) 10/07/2021   Excellent control.  Continue metformin 1000mg  twice a day, rybelsus 7mg 

## 2021-10-07 NOTE — Assessment & Plan Note (Signed)
Chronic, stable.  Continue Crestor 10 mg.  We will update lipid panel at CPE

## 2021-10-07 NOTE — Progress Notes (Signed)
Subjective:    Patient ID: Laurie Cameron, female    DOB: [birth year], 36 y.o.   MRN: [id number]  CC: Laurie Cameron is a 36 y.o. female who presents today for follow up.   HPI: She describes over the last couple months on the ventral side of her right wrist a small, previously tender cyst.  It is bothersome as it gets in the way of her typing.  Denies swelling, erythema, numbness, drainage.   Otherwise, she feels well  She is following with endocrinology for hypothyroidism  HTN- compliant with amlodipine 13m, lisinopril 153m DM- compliant with metformin 100039mwice a day, rybelsus 7mg72mhe has lost approx 15 lbs. She is tolerating rybelsus with rare episodes of nausea.   HLD- compliant with crestor 10mg35mHISTORY:  Past Medical History:  Diagnosis Date   Asthma    AS A CHILD- NO INHALERS   Dizziness    happened when first diagnosed with hypertension   Elevated blood pressure reading 08/30/2016   Frequent headaches    GERD (gastroesophageal reflux disease)    OCC-TUMS PRN   Hypertension    RUQ pain 08/30/2016   Past Surgical History:  Procedure Laterality Date   CHOLECYSTECTOMY N/A 02/01/2017   Procedure: LAPAROSCOPIC CHOLECYSTECTOMY;  Surgeon: WoodhClayburn Pert  Location: [facility] ORS;  Service: General;  Laterality: N/A;   NO PAST SURGERIES     Family History  Problem Relation Age of Onset   Arthritis Mother    Hyperlipidemia Mother    Hypertension Mother    Breast cancer Mother 61   4  negative BRCA testing   Arthritis Father    Hyperlipidemia Father    Heart disease Father    Hypertension Father    GER disease Father    Arthritis Maternal Grandmother    Hyperlipidemia Maternal Grandmother    Heart disease Maternal Grandmother    Hypertension Maternal Grandmother    Diabetes Maternal Grandmother    Breast cancer Maternal Grandmother 70   96thritis Maternal Grandfather    Hyperlipidemia Maternal Grandfather    Alcohol abuse  Paternal Grandmother    Arthritis Paternal Grandmother    Hyperlipidemia Paternal Grandmother    Hypertension Paternal Grandmother    Mental illness Paternal Grandmother    Alcohol abuse Paternal Grandfather    Arthritis Paternal Grandfather    Hyperlipidemia Paternal Grandfather    Hyperlipidemia Maternal Aunt    Hypertension Maternal Aunt    Diabetes Maternal Aunt    Breast cancer Maternal Aunt 40   Deep vein thrombosis Maternal Aunt    Cancer Maternal Uncle    Mental illness Paternal Aunt    Alcohol abuse Paternal Uncle    Cancer Paternal Uncle    Colon cancer Neg Hx    Thyroid cancer Neg Hx     Allergies: Patient has no known allergies. Current Outpatient Medications on File Prior to Visit  Medication Sig Dispense Refill   amLODipine (NORVASC) 5 MG tablet TAKE 1 TABLET BY MOUTH  DAILY 90 tablet 3   calcium carbonate (TUMS - DOSED IN MG ELEMENTAL CALCIUM) 500 MG chewable tablet Chew 1 tablet by mouth as needed for indigestion or heartburn.     ibuprofen (ADVIL,MOTRIN) 200 MG tablet Take 400 mg by mouth every 6 (six) hours as needed for mild pain.     levothyroxine (SYNTHROID) 88 MCG tablet Take 88 mcg by mouth daily.     lisinopril (ZESTRIL) 10 MG tablet TAKE 1 AND 1/2 TABLETS(15 MG) BY  MOUTH DAILY 90 tablet 1   metFORMIN (GLUMETZA) 1000 MG (MOD) 24 hr tablet TAKE 1 TABLET BY MOUTH  TWICE DAILY WITH MEALS 180 tablet 3   Multiple Vitamins-Minerals (EQ MULTIVITAMINS ADULT GUMMY PO) Take 2 tablets by mouth daily.     rosuvastatin (CRESTOR) 10 MG tablet TAKE 1 TABLET(10 MG) BY MOUTH DAILY 90 tablet 3   RYBELSUS 7 MG TABS TAKE 1 TABLET BY MOUTH DAILY 90 tablet 1   No current facility-administered medications on file prior to visit.    Social History   Tobacco Use   Smoking status: Never   Smokeless tobacco: Never  Vaping Use   Vaping Use: Never used  Substance Use Topics   Alcohol use: Yes    Comment: RARE   Drug use: No    Review of  Systems  Constitutional:  Negative for chills and fever.  Respiratory:  Negative for cough.   Cardiovascular:  Negative for chest pain and palpitations.  Gastrointestinal:  Negative for nausea and vomiting.  Neurological:  Negative for numbness.     Objective:    BP 128/74 (BP Location: Left Arm, Patient Position: Sitting, Cuff Size: Large)    Pulse 94    Temp 98.4 F (36.9 C) (Oral)    Ht '5\' 9"'  (1.753 m)    Wt (!) 305 lb 6.4 oz (138.5 kg)    SpO2 98%    BMI 45.10 kg/m  BP Readings from Last 3 Encounters:  10/07/21 128/74  12/17/20 124/80  10/18/20 122/70   Wt Readings from Last 3 Encounters:  10/07/21 (!) 305 lb 6.4 oz (138.5 kg)  12/17/20 (!) 320 lb 3.2 oz (145.2 kg)  10/18/20 (!) 317 lb 9.6 oz (144.1 kg)    Physical Exam Vitals reviewed.  Constitutional:      Appearance: She is well-developed.  Eyes:     Conjunctiva/sclera: Conjunctivae normal.  Cardiovascular:     Rate and Rhythm: Normal rate and regular rhythm.     Pulses: Normal pulses.     Heart sounds: Normal heart sounds.  Pulmonary:     Effort: Pulmonary effort is normal.     Breath sounds: Normal breath sounds. No wheezing, rhonchi or rales.  Musculoskeletal:     Comments: Ventral side right wrist proximal base of thumb 2-3 cm hard, immobile mass. Non tender, non fluctuant. No erythema, increased heat.   Skin:    General: Skin is warm and dry.  Neurological:     Mental Status: She is alert.  Psychiatric:        Speech: Speech normal.        Behavior: Behavior normal.        Thought Content: Thought content normal.        Assessment & Plan:   Problem List Items Addressed This Visit       Cardiovascular and Mediastinum   HTN (hypertension)    Chronic, stable. Continue amlodipine 76m, lisinopril 175m       Endocrine   Diabetes mellitus without [place] ([place]- Primary    Lab Results  Component Value Date   HGBA1C 5.9 (A) 10/07/2021  Excellent control.  Continue metformin 100033mwice a  day, rybelsus 7mg38m   Relevant Orders   POCT HgB A1C (Completed)     Other   Ganglion cyst    Presentation most consistent with ganglion cyst.  No symptoms of carpal tunnel. Advised consult with general surgery for further confirmation of diagnosis as well as discussion removal  as quite bothersome for her.      Relevant Orders   Ambulatory referral to General Surgery   HLD (hyperlipidemia)    Chronic, stable.  Continue Crestor 10 mg.  We will update lipid panel at CPE        I am having Taylee Gunnells maintain her Multiple Vitamins-Minerals (EQ MULTIVITAMINS ADULT GUMMY PO), ibuprofen, calcium carbonate, rosuvastatin, amLODipine, metFORMIN, Rybelsus, lisinopril, and levothyroxine.   No orders of the defined types were placed in this encounter.   Return precautions given.   Risks, benefits, and alternatives of the medications and treatment plan prescribed today were discussed, and patient expressed understanding.   Education regarding symptom management and diagnosis given to patient on AVS.  Continue to follow with Burnard Hawthorne, FNP for routine health maintenance.   Ileene Hutchinson and I agreed with plan.   Mable Paris, FNP

## 2021-10-07 NOTE — Assessment & Plan Note (Signed)
Presentation most consistent with ganglion cyst.  No symptoms of carpal tunnel. Advised consult with general surgery for further confirmation of diagnosis as well as discussion removal as quite bothersome for her.

## 2021-10-07 NOTE — Assessment & Plan Note (Signed)
Chronic, stable.  Continue amlodipine 5 mg, lisinopril 15 mg. 

## 2021-10-17 ENCOUNTER — Other Ambulatory Visit: Payer: Self-pay | Admitting: Family

## 2021-10-17 DIAGNOSIS — E119 Type 2 diabetes mellitus without complications: Secondary | ICD-10-CM

## 2021-10-17 DIAGNOSIS — I10 Essential (primary) hypertension: Secondary | ICD-10-CM

## 2021-11-08 ENCOUNTER — Ambulatory Visit: Payer: Managed Care, Other (non HMO) | Admitting: Orthopaedic Surgery

## 2021-11-15 ENCOUNTER — Ambulatory Visit (INDEPENDENT_AMBULATORY_CARE_PROVIDER_SITE_OTHER): Payer: Managed Care, Other (non HMO)

## 2021-11-15 ENCOUNTER — Ambulatory Visit: Payer: Managed Care, Other (non HMO) | Admitting: Orthopaedic Surgery

## 2021-11-15 ENCOUNTER — Encounter: Payer: Self-pay | Admitting: Orthopaedic Surgery

## 2021-11-15 ENCOUNTER — Other Ambulatory Visit: Payer: Self-pay

## 2021-11-15 DIAGNOSIS — M25531 Pain in right wrist: Secondary | ICD-10-CM | POA: Diagnosis not present

## 2021-11-15 DIAGNOSIS — M6740 Ganglion, unspecified site: Secondary | ICD-10-CM | POA: Diagnosis not present

## 2021-11-15 NOTE — Progress Notes (Signed)
Office Visit Note   Patient: Laurie Cameron           Date of Birth: [birth year]           MRN: [id number] Visit Date: 11/15/2021              Requested by: Burnard Hawthorne, FNP [address] [place],  [place] [address] PCP: Burnard Hawthorne, FNP   Assessment & Plan: Visit Diagnoses:  1. Ganglion cyst   2. Pain in right wrist     Plan: Impression is right wrist volar ganglion cyst.  Today, we discussed nonoperative versus operative treatment options to include aspiration, injection versus ganglion cyst removal.  She would like to think about this for now.  She will follow-up with [place] as needed.  Call with concerns or questions.  Follow-Up Instructions: Return if symptoms worsen or fail to improve.   Orders:  Orders Placed This Encounter  Procedures   XR Wrist Complete Right   No orders of the defined types were placed in this encounter.     Procedures: No procedures performed   Clinical Data: No additional findings.   Subjective: Chief Complaint  Patient presents with   Right Wrist - Pain    HPI patient is a pleasant 36 year old right-hand-dominant female who comes in today with complaints of a ganglion cyst to the right wrist.  She noticed this back in November 2022.  No known injury or change in activity.  She notes that the cyst has grown in size over the past few months.  She denies any pain other than when she applies pressure to the area.  She denies any paresthesias.  No fevers or chills.  Review of Systems as detailed in HPI.  All others reviewed and are negative.   Objective: Vital Signs: There were no vitals taken for this visit.  Physical Exam well-developed well-nourished female no acute distress.  Alert and oriented x3.  Ortho Exam right wrist exam shows a palpable, mildly tender marble sized ganglion cyst to the volar side of the distal radius.  No skin changes.  Slight increased pain with radial deviation.  She is neurovascular intact  distally.  Specialty Comments:  No specialty comments available.  Imaging: XR Wrist Complete Right  Result Date: 11/15/2021 No acute or structural abnormalities    PMFS History: Patient Active Problem List   Diagnosis Date Noted   Ganglion cyst 10/07/2021   HLD (hyperlipidemia) 12/17/2020   COVID-19 10/15/2019   Vertigo 01/24/2019   BMI 45.0-49.9, adult ([place]) 10/18/2018   Diabetes mellitus without complication ([place]) 56/21/3086   Nodule of skin of right hand 57/84/6962   Folliculitis 95/28/4132   Family history of bleeding disorder 12/05/2017   Hypothyroidism 12/05/2017   Other fatigue 12/05/2017   HTN (hypertension) 08/30/2016   Routine physical examination 08/30/2016   Past Medical History:  Diagnosis Date   Asthma    AS A CHILD- NO INHALERS   Dizziness    happened when first diagnosed with hypertension   Elevated blood pressure reading 08/30/2016   Frequent headaches    GERD (gastroesophageal reflux disease)    OCC-TUMS PRN   Hypertension    RUQ pain 08/30/2016    Family History  Problem Relation Age of Onset   Arthritis Mother    Hyperlipidemia Mother    Hypertension Mother    Breast cancer Mother 66       negative BRCA testing   Arthritis Father    Hyperlipidemia Father  Heart disease Father    Hypertension Father    GER disease Father    Arthritis Maternal Grandmother    Hyperlipidemia Maternal Grandmother    Heart disease Maternal Grandmother    Hypertension Maternal Grandmother    Diabetes Maternal Grandmother    Breast cancer Maternal Grandmother 90   Arthritis Maternal Grandfather    Hyperlipidemia Maternal Grandfather    Alcohol abuse Paternal Grandmother    Arthritis Paternal Grandmother    Hyperlipidemia Paternal Grandmother    Hypertension Paternal Grandmother    Mental illness Paternal Grandmother    Alcohol abuse Paternal Grandfather    Arthritis Paternal Grandfather    Hyperlipidemia Paternal Grandfather    Hyperlipidemia Maternal  Aunt    Hypertension Maternal Aunt    Diabetes Maternal Aunt    Breast cancer Maternal Aunt 40   Deep vein thrombosis Maternal Aunt    Cancer Maternal Uncle    Mental illness Paternal Aunt    Alcohol abuse Paternal Uncle    Cancer Paternal Uncle    Colon cancer Neg Hx    Thyroid cancer Neg Hx     Past Surgical History:  Procedure Laterality Date   CHOLECYSTECTOMY N/A 02/01/2017   Procedure: LAPAROSCOPIC CHOLECYSTECTOMY;  Surgeon: Clayburn Pert, MD;  Location: [facility] ORS;  Service: General;  Laterality: N/A;   NO PAST SURGERIES     Social History   Occupational History   Not on file  Tobacco Use   Smoking status: Never   Smokeless tobacco: Never  Vaping Use   Vaping Use: Never used  Substance and Sexual Activity   Alcohol use: Yes    Comment: RARE   Drug use: No   Sexual activity: Not on file

## 2021-11-21 ENCOUNTER — Other Ambulatory Visit: Payer: Self-pay

## 2021-11-21 ENCOUNTER — Encounter: Payer: Self-pay | Admitting: Family

## 2021-11-21 ENCOUNTER — Ambulatory Visit (INDEPENDENT_AMBULATORY_CARE_PROVIDER_SITE_OTHER): Payer: Managed Care, Other (non HMO) | Admitting: Family

## 2021-11-21 VITALS — BP 128/82 | HR 97 | Temp 98.7°F | Ht 69.0 in | Wt 306.9 lb

## 2021-11-21 DIAGNOSIS — I10 Essential (primary) hypertension: Secondary | ICD-10-CM | POA: Diagnosis not present

## 2021-11-21 DIAGNOSIS — E119 Type 2 diabetes mellitus without complications: Secondary | ICD-10-CM | POA: Diagnosis not present

## 2021-11-21 DIAGNOSIS — Z0000 Encounter for general adult medical examination without abnormal findings: Secondary | ICD-10-CM

## 2021-11-21 DIAGNOSIS — Z1231 Encounter for screening mammogram for malignant neoplasm of breast: Secondary | ICD-10-CM

## 2021-11-21 NOTE — Assessment & Plan Note (Signed)
Excellent control.  Continue metformin 1000 mg twice a day, Rybelsus 7 mg

## 2021-11-21 NOTE — Patient Instructions (Signed)
Nice to see you! ° °Health Maintenance, Female °Adopting a healthy lifestyle and getting preventive care are important in promoting health and wellness. Ask your health care provider about: °The right schedule for you to have regular tests and exams. °Things you can do on your own to prevent diseases and keep yourself healthy. °What should I know about diet, weight, and exercise? °Eat a healthy diet ° °Eat a diet that includes plenty of vegetables, fruits, low-fat dairy products, and lean protein. °Do not eat a lot of foods that are high in solid fats, added sugars, or sodium. °Maintain a healthy weight °Body mass index (BMI) is used to identify weight problems. It estimates body fat based on height and weight. Your health care provider can help determine your BMI and help you achieve or maintain a healthy weight. °Get regular exercise °Get regular exercise. This is one of the most important things you can do for your health. Most adults should: °Exercise for at least 150 minutes each week. The exercise should increase your heart rate and make you sweat (moderate-intensity exercise). °Do strengthening exercises at least twice a week. This is in addition to the moderate-intensity exercise. °Spend less time sitting. Even light physical activity can be beneficial. °Watch cholesterol and blood lipids °Have your blood tested for lipids and cholesterol at 37 years of age, then have this test every 5 years. °Have your cholesterol levels checked more often if: °Your lipid or cholesterol levels are high. °You are older than 37 years of age. °You are at high risk for heart disease. °What should I know about cancer screening? °Depending on your health history and family history, you may need to have cancer screening at various ages. This may include screening for: °Breast cancer. °Cervical cancer. °Colorectal cancer. °Skin cancer. °Lung cancer. °What should I know about heart disease, diabetes, and high blood pressure? °Blood  pressure and heart disease °High blood pressure causes heart disease and increases the risk of stroke. This is more likely to develop in people who have high blood pressure readings or are overweight. °Have your blood pressure checked: °Every 3-5 years if you are 18-39 years of age. °Every year if you are 40 years old or older. °Diabetes °Have regular diabetes screenings. This checks your fasting blood sugar level. Have the screening done: °Once every three years after age 40 if you are at a normal weight and have a low risk for diabetes. °More often and at a younger age if you are overweight or have a high risk for diabetes. °What should I know about preventing infection? °Hepatitis B °If you have a higher risk for hepatitis B, you should be screened for this virus. Talk with your health care provider to find out if you are at risk for hepatitis B infection. °Hepatitis C °Testing is recommended for: °Everyone born from 1945 through 1965. °Anyone with known risk factors for hepatitis C. °Sexually transmitted infections (STIs) °Get screened for STIs, including gonorrhea and chlamydia, if: °You are sexually active and are younger than 37 years of age. °You are older than 37 years of age and your health care provider tells you that you are at risk for this type of infection. °Your sexual activity has changed since you were last screened, and you are at increased risk for chlamydia or gonorrhea. Ask your health care provider if you are at risk. °Ask your health care provider about whether you are at high risk for HIV. Your health care provider may recommend a prescription   medicine to help prevent HIV infection. If you choose to take medicine to prevent HIV, you should first get tested for HIV. You should then be tested every 3 months for as long as you are taking the medicine. °Pregnancy °If you are about to stop having your period (premenopausal) and you may become pregnant, seek counseling before you get  pregnant. °Take 400 to 800 micrograms (mcg) of folic acid every day if you become pregnant. °Ask for birth control (contraception) if you want to prevent pregnancy. °Osteoporosis and menopause °Osteoporosis is a disease in which the bones lose minerals and strength with aging. This can result in bone fractures. If you are 65 years old or older, or if you are at risk for osteoporosis and fractures, ask your health care provider if you should: °Be screened for bone loss. °Take a calcium or vitamin D supplement to lower your risk of fractures. °Be given hormone replacement therapy (HRT) to treat symptoms of menopause. °Follow these instructions at home: °Alcohol use °Do not drink alcohol if: °Your health care provider tells you not to drink. °You are pregnant, may be pregnant, or are planning to become pregnant. °If you drink alcohol: °Limit how much you have to: °0-1 drink a day. °Know how much alcohol is in your drink. In the [place] one drink equals one 12 oz bottle of beer (355 mL), one 5 oz glass of wine (148 mL), or one 1½ oz glass of hard liquor (44 mL). °Lifestyle °Do not use any products that contain nicotine or tobacco. These products include cigarettes, chewing tobacco, and vaping devices, such as e-cigarettes. If you need help quitting, ask your health care provider. °Do not use street drugs. °Do not share needles. °Ask your health care provider for help if you need support or information about quitting drugs. °General instructions °Schedule regular health, dental, and eye exams. °Stay current with your vaccines. °Tell your health care provider if: °You often feel depressed. °You have ever been abused or do not feel safe at home. °Summary °Adopting a healthy lifestyle and getting preventive care are important in promoting health and wellness. °Follow your health care provider's instructions about healthy diet, exercising, and getting tested or screened for diseases. °Follow your health care provider's  instructions on monitoring your cholesterol and blood pressure. °This information is not intended to replace advice given to you by your health care provider. Make sure you discuss any questions you have with your health care provider. °Document Revised: 01/31/2021 Document Reviewed: 01/31/2021 °Elsevier Patient Education © 2022 [employer] ° °

## 2021-11-21 NOTE — Progress Notes (Signed)
Subjective:    Patient ID: Laurie Cameron, female    DOB: Sep 02, 1986, 36 y.o.   MRN: [id number]  CC: Laurie Cameron is a 36 y.o. female who presents today for physical exam.    HPI: Feels well today.  No new complaints  HTN- compliant with amlodipine 68m, lisinopril 129mHLD-compliant with Crestor 10 mg DM-she remains compliant with metformin 1000 mg twice a day, Rybelsus 7 mg   Colorectal Cancer Screening: No family history colon cancer Breast Cancer Screening: Mother has history of breast cancer at 614Due for mammogram , baseline.  Cervical Cancer Screening: UTD, performed 10/18/2020 negative HPV, malignancy        Tetanus - UTD    Labs: Screening labs today. Exercise: Gets regular exercise walking 3-4 x per day.  She works in her garden Alcohol use:  rare Smoking/tobacco use: Nonsmoker.     HISTORY:  Past Medical History:  Diagnosis Date   Asthma    AS A CHILD- NO INHALERS   Dizziness    happened when first diagnosed with hypertension   Elevated blood pressure reading 08/30/2016   Frequent headaches    GERD (gastroesophageal reflux disease)    OCC-TUMS PRN   Hypertension    RUQ pain 08/30/2016    Past Surgical History:  Procedure Laterality Date   CHOLECYSTECTOMY N/A 02/01/2017   Procedure: LAPAROSCOPIC CHOLECYSTECTOMY;  Surgeon: WoClayburn PertMD;  Location: [facility] ORS;  Service: General;  Laterality: N/A;   NO PAST SURGERIES     Family History  Problem Relation Age of Onset   Arthritis Mother    Hyperlipidemia Mother    Hypertension Mother    Breast cancer Mother [id number]     negative BRCA testing   Arthritis Father    Hyperlipidemia Father    Heart disease Father    Hypertension Father    GER disease Father    Arthritis Maternal Grandmother    Hyperlipidemia Maternal Grandmother    Heart disease Maternal Grandmother    Hypertension Maternal Grandmother    Diabetes Maternal Grandmother    Breast cancer Maternal Grandmother 704 Arthritis Maternal  Grandfather    Hyperlipidemia Maternal Grandfather    Alcohol abuse Paternal Grandmother    Arthritis Paternal Grandmother    Hyperlipidemia Paternal Grandmother    Hypertension Paternal Grandmother    Mental illness Paternal Grandmother    Alcohol abuse Paternal Grandfather    Arthritis Paternal Grandfather    Hyperlipidemia Paternal Grandfather    Hyperlipidemia Maternal Aunt    Hypertension Maternal Aunt    Diabetes Maternal Aunt    Breast cancer Maternal Aunt 40   Deep vein thrombosis Maternal Aunt    Cancer Maternal Uncle    Mental illness Paternal Aunt    Alcohol abuse Paternal Uncle    Cancer Paternal Uncle    Colon cancer Neg Hx    Thyroid cancer Neg Hx       ALLERGIES: Patient has no known allergies.  Current Outpatient Medications on File Prior to Visit  Medication Sig Dispense Refill   amLODipine (NORVASC) 5 MG tablet TAKE 1 TABLET BY MOUTH  DAILY 90 tablet 3   calcium carbonate (TUMS - DOSED IN MG ELEMENTAL CALCIUM) 500 MG chewable tablet Chew 1 tablet by mouth as needed for indigestion or heartburn.     ibuprofen (ADVIL,MOTRIN) 200 MG tablet Take 400 mg by mouth every 6 (six) hours as needed for mild pain.     levothyroxine (SYNTHROID) 88 MCG tablet Take  88 mcg by mouth daily.     lisinopril (ZESTRIL) 10 MG tablet TAKE 1 AND 1/2 TABLETS(15 MG) BY MOUTH DAILY 90 tablet 1   metFORMIN (GLUMETZA) 1000 MG (MOD) 24 hr tablet TAKE 1 TABLET BY MOUTH  TWICE DAILY WITH MEALS 180 tablet 3   Multiple Vitamins-Minerals (EQ MULTIVITAMINS ADULT GUMMY PO) Take 2 tablets by mouth daily.     rosuvastatin (CRESTOR) 10 MG tablet TAKE 1 TABLET(10 MG) BY MOUTH DAILY 90 tablet 3   RYBELSUS 7 MG TABS TAKE 1 TABLET BY MOUTH DAILY 90 tablet 1   No current facility-administered medications on file prior to visit.    Social History   Tobacco Use   Smoking status: Never   Smokeless tobacco: Never  Vaping Use   Vaping Use: Never used  Substance Use Topics   Alcohol use: Yes     Comment: RARE   Drug use: No    Review of Systems  Constitutional:  Negative for chills, fever and unexpected weight change.  HENT:  Negative for congestion.   Respiratory:  Negative for cough.   Cardiovascular:  Negative for chest pain, palpitations and leg swelling.  Gastrointestinal:  Negative for nausea and vomiting.  Musculoskeletal:  Negative for arthralgias and myalgias.  Skin:  Negative for rash.  Neurological:  Negative for headaches.  Hematological:  Negative for adenopathy.  Psychiatric/Behavioral:  Negative for confusion.      Objective:    BP 128/82 (BP Location: Left Arm, Patient Position: Sitting, Cuff Size: Large)    Pulse 97    Temp 98.7 F (37.1 C) (Oral)    Ht '5\' 9"'  (1.753 m)    Wt (!) 306 lb 14.4 oz (139.2 kg)    LMP 11/03/2021 (Exact Date)    SpO2 99%    BMI 45.32 kg/m   BP Readings from Last 3 Encounters:  11/21/21 128/82  10/07/21 128/74  12/17/20 124/80   Wt Readings from Last 3 Encounters:  11/21/21 (!) 306 lb 14.4 oz (139.2 kg)  10/07/21 (!) 305 lb 6.4 oz (138.5 kg)  12/17/20 (!) 320 lb 3.2 oz (145.2 kg)    Physical Exam Vitals reviewed.  Constitutional:      Appearance: Normal appearance. She is well-developed.  Eyes:     Conjunctiva/sclera: Conjunctivae normal.  Neck:     Thyroid: No thyroid mass or thyromegaly.  Cardiovascular:     Rate and Rhythm: Normal rate and regular rhythm.     Pulses: Normal pulses.     Heart sounds: Normal heart sounds.  Pulmonary:     Effort: Pulmonary effort is normal.     Breath sounds: Normal breath sounds. No wheezing, rhonchi or rales.  Chest:  Breasts:    Breasts are symmetrical.     Right: No inverted nipple, mass, nipple discharge, skin change or tenderness.     Left: No inverted nipple, mass, nipple discharge, skin change or tenderness.  Abdominal:     General: Bowel sounds are normal. There is no distension.     Palpations: Abdomen is soft. Abdomen is not rigid. There is no fluid wave or mass.      Tenderness: There is no abdominal tenderness. There is no guarding or rebound.  Lymphadenopathy:     Head:     Right side of head: No submental, submandibular, tonsillar, preauricular, posterior auricular or occipital adenopathy.     Left side of head: No submental, submandibular, tonsillar, preauricular, posterior auricular or occipital adenopathy.     Cervical: No cervical  adenopathy.     Right cervical: No superficial, deep or posterior cervical adenopathy.    Left cervical: No superficial, deep or posterior cervical adenopathy.  Skin:    General: Skin is warm and dry.  Neurological:     Mental Status: She is alert.  Psychiatric:        Speech: Speech normal.        Behavior: Behavior normal.        Thought Content: Thought content normal.       Assessment & Plan:   Problem List Items Addressed This Visit       Cardiovascular and Mediastinum   HTN (hypertension)    Chronic, stable.  Continue amlodipine 19m, lisinopril 126m       Endocrine   Diabetes mellitus without complication (HCC) - Primary    Excellent control.  Continue metformin 1000 mg twice a day, Rybelsus 7 mg      Relevant Orders   VITAMIN D 25 Hydroxy (Vit-D Deficiency, Fractures)   TSH   CBC with Differential/Platelet   Comprehensive metabolic panel   Lipid panel   Urine Microalbumin w/creat. ratio     Other   Routine physical examination    Clinical breast exam performed today.  We have scheduled baseline mammogram,particularly because of her mother's history of breast cancer.  Deferred pelvic exam in the absence of complaints and Pap smear is up-to-date.  Encouraged continued exercise.      Other Visit Diagnoses     Encounter for screening mammogram for malignant neoplasm of breast       Relevant Orders   MM 3D SCREEN BREAST BILATERAL        I am having SaBrittay Mogleaintain her Multiple Vitamins-Minerals (EQ MULTIVITAMINS ADULT GUMMY PO), ibuprofen, calcium carbonate, rosuvastatin,  amLODipine, metFORMIN, Rybelsus, levothyroxine, and lisinopril.   No orders of the defined types were placed in this encounter.   Return precautions given.   Risks, benefits, and alternatives of the medications and treatment plan prescribed today were discussed, and patient expressed understanding.   Education regarding symptom management and diagnosis given to patient on AVS.   Continue to follow with ArBurnard HawthorneFNP for routine health maintenance.   SaIleene Hutchinsonnd I agreed with plan.   MaMable ParisFNP

## 2021-11-21 NOTE — Assessment & Plan Note (Signed)
Chronic, stable.  Continue amlodipine 5 mg, lisinopril 15 mg. 

## 2021-11-21 NOTE — Assessment & Plan Note (Addendum)
Clinical breast exam performed today.  We have scheduled baseline mammogram,particularly because of her mother's history of breast cancer.  Deferred pelvic exam in the absence of complaints and Pap smear is up-to-date.  Encouraged continued exercise.

## 2021-11-25 LAB — COMPREHENSIVE METABOLIC PANEL
ALT: 16 IU/L (ref 0–32)
AST: 28 IU/L (ref 0–40)
Albumin/Globulin Ratio: 1.9 (ref 1.2–2.2)
Albumin: 4.4 g/dL (ref 3.8–4.8)
Alkaline Phosphatase: 44 IU/L (ref 44–121)
BUN/Creatinine Ratio: 14 (ref 9–23)
BUN: 10 mg/dL (ref 6–20)
Bilirubin Total: 0.4 mg/dL (ref 0.0–1.2)
CO2: 22 mmol/L (ref 20–29)
Calcium: 9.6 mg/dL (ref 8.7–10.2)
Chloride: 99 mmol/L (ref 96–106)
Creatinine, Ser: 0.69 mg/dL (ref 0.57–1.00)
Globulin, Total: 2.3 g/dL (ref 1.5–4.5)
Glucose: 116 mg/dL — ABNORMAL HIGH (ref 70–99)
Potassium: 4.6 mmol/L (ref 3.5–5.2)
Sodium: 138 mmol/L (ref 134–144)
Total Protein: 6.7 g/dL (ref 6.0–8.5)
eGFR: 115 mL/min/{1.73_m2} (ref >59)

## 2021-11-25 LAB — CBC WITH DIFFERENTIAL/PLATELET
Basophils Absolute: 0.1 10*3/uL (ref 0.0–0.2)
Basos: 1 %
EOS (ABSOLUTE): 0.3 10*3/uL (ref 0.0–0.4)
Eos: 3 %
Hematocrit: 37.8 % (ref 34.0–46.6)
Hemoglobin: 12.5 g/dL (ref 11.1–15.9)
Immature Grans (Abs): 0 10*3/uL (ref 0.0–0.1)
Immature Granulocytes: 0 %
Lymphocytes Absolute: 2.7 10*3/uL (ref 0.7–3.1)
Lymphs: 30 %
MCH: 28.3 pg (ref 26.6–33.0)
MCHC: 33.1 g/dL (ref 31.5–35.7)
MCV: 86 fL (ref 79–97)
Monocytes Absolute: 0.6 10*3/uL (ref 0.1–0.9)
Monocytes: 6 %
Neutrophils Absolute: 5.4 10*3/uL (ref 1.4–7.0)
Neutrophils: 60 %
Platelets: 308 10*3/uL (ref 150–450)
RBC: 4.42 x10E6/uL (ref 3.77–5.28)
RDW: 12.4 % (ref 11.7–15.4)
WBC: 9.1 10*3/uL (ref 3.4–10.8)

## 2021-11-25 LAB — LIPID PANEL
Chol/HDL Ratio: 2.5 ratio (ref 0.0–4.4)
Cholesterol, Total: 140 mg/dL (ref 100–199)
HDL: 55 mg/dL (ref >39)
LDL Chol Calc (NIH): 57 mg/dL (ref 0–99)
Triglycerides: 167 mg/dL — ABNORMAL HIGH (ref 0–149)
VLDL Cholesterol Cal: 28 mg/dL (ref 5–40)

## 2021-11-25 LAB — TSH: TSH: 3.26 u[IU]/mL (ref 0.450–4.500)

## 2021-11-25 LAB — MICROALBUMIN / CREATININE URINE RATIO
Creatinine, Urine: 44.8 mg/dL
Microalb/Creat Ratio: 33 mg/g creat — ABNORMAL HIGH (ref 0–29)
Microalbumin, Urine: 14.6 ug/mL

## 2021-11-25 LAB — VITAMIN D 25 HYDROXY (VIT D DEFICIENCY, FRACTURES): Vit D, 25-Hydroxy: 24.3 ng/mL — ABNORMAL LOW (ref 30.0–100.0)

## 2021-12-26 ENCOUNTER — Other Ambulatory Visit: Payer: Self-pay | Admitting: Family

## 2021-12-26 DIAGNOSIS — E119 Type 2 diabetes mellitus without complications: Secondary | ICD-10-CM

## 2021-12-27 ENCOUNTER — Ambulatory Visit
Admission: RE | Admit: 2021-12-27 | Discharge: 2021-12-27 | Disposition: A | Discharge status: Home / Self Care | Payer: Managed Care, Other (non HMO) | Source: Ambulatory Visit | Attending: Family | Admitting: Family

## 2021-12-27 DIAGNOSIS — Z1231 Encounter for screening mammogram for malignant neoplasm of breast: Secondary | ICD-10-CM | POA: Diagnosis present

## 2022-01-26 ENCOUNTER — Other Ambulatory Visit: Payer: Self-pay

## 2022-01-26 DIAGNOSIS — I10 Essential (primary) hypertension: Secondary | ICD-10-CM

## 2022-01-26 DIAGNOSIS — E119 Type 2 diabetes mellitus without complications: Secondary | ICD-10-CM

## 2022-01-26 MED ORDER — LISINOPRIL 10 MG PO TABS: ORAL_TABLET | ORAL | 1 refills | Status: DC

## 2022-02-21 ENCOUNTER — Other Ambulatory Visit: Payer: Self-pay

## 2022-02-21 DIAGNOSIS — E119 Type 2 diabetes mellitus without complications: Secondary | ICD-10-CM

## 2022-02-21 DIAGNOSIS — I10 Essential (primary) hypertension: Secondary | ICD-10-CM

## 2022-02-21 MED ORDER — LISINOPRIL 10 MG PO TABS: ORAL_TABLET | ORAL | 1 refills | Status: DC

## 2022-04-12 ENCOUNTER — Other Ambulatory Visit: Payer: Self-pay | Admitting: Family

## 2022-04-12 DIAGNOSIS — E119 Type 2 diabetes mellitus without complications: Secondary | ICD-10-CM

## 2022-04-12 MED ORDER — METFORMIN HCL ER (MOD) 1000 MG PO TB24: 1000.0000 mg | ORAL_TABLET | Freq: Two times a day (BID) | ORAL | 3 refills | Status: DC

## 2022-04-12 NOTE — Progress Notes (Signed)
Rx sent 

## 2022-04-14 ENCOUNTER — Other Ambulatory Visit: Payer: Self-pay | Admitting: Family

## 2022-04-14 DIAGNOSIS — E119 Type 2 diabetes mellitus without complications: Secondary | ICD-10-CM

## 2022-04-14 MED ORDER — METFORMIN HCL ER 500 MG PO TB24: 1000.0000 mg | ORAL_TABLET | Freq: Two times a day (BID) | ORAL | 3 refills | Status: DC

## 2022-04-21 ENCOUNTER — Ambulatory Visit: Payer: Managed Care, Other (non HMO) | Admitting: Family

## 2022-04-24 ENCOUNTER — Encounter: Payer: Self-pay | Admitting: Family

## 2022-04-24 ENCOUNTER — Ambulatory Visit: Payer: Managed Care, Other (non HMO) | Admitting: Family

## 2022-04-24 VITALS — BP 134/84 | HR 81 | Temp 98.3°F | Ht 69.0 in | Wt 307.8 lb

## 2022-04-24 DIAGNOSIS — E119 Type 2 diabetes mellitus without complications: Secondary | ICD-10-CM

## 2022-04-24 DIAGNOSIS — I10 Essential (primary) hypertension: Secondary | ICD-10-CM | POA: Diagnosis not present

## 2022-04-24 LAB — POCT GLYCOSYLATED HEMOGLOBIN (HGB A1C): Hemoglobin A1C: 6.1 % — AB (ref 4.0–5.6)

## 2022-04-24 NOTE — Progress Notes (Signed)
Discussed during OV. Please see OV notes

## 2022-04-24 NOTE — Progress Notes (Signed)
Subjective:    Patient ID: Laurie Cameron, female    DOB: [birth year], 36 y.o.   MRN: [id number]  CC: Laurie Cameron is a 36 y.o. female who presents today for follow up.   HPI: Feels well today.  No new complaints   Diabetes-compliant with metformin 1000 mg twice a day, Rybelsus 7 mg.  Eye exam is scheduled  Hypertension-compliant with amlodipine 5 mg, lisinopril 15 mg.  She previously declined increasing lisinopril.  Blood pressure is 118/70 at home.  HISTORY:  Past Medical History:  Diagnosis Date   Asthma    AS A CHILD- NO INHALERS   Dizziness    happened when first diagnosed with hypertension   Elevated blood pressure reading 08/30/2016   Frequent headaches    GERD (gastroesophageal reflux disease)    OCC-TUMS PRN   Hypertension    RUQ pain 08/30/2016   Past Surgical History:  Procedure Laterality Date   CHOLECYSTECTOMY N/A 02/01/2017   Procedure: LAPAROSCOPIC CHOLECYSTECTOMY;  Surgeon: Clayburn Pert, MD;  Location: [facility] ORS;  Service: General;  Laterality: N/A;   NO PAST SURGERIES     Family History  Problem Relation Age of Onset   Arthritis Mother    Hyperlipidemia Mother    Hypertension Mother    Breast cancer Mother 26       negative BRCA testing   Arthritis Father    Hyperlipidemia Father    Heart disease Father    Hypertension Father    GER disease Father    Arthritis Maternal Grandmother    Hyperlipidemia Maternal Grandmother    Heart disease Maternal Grandmother    Hypertension Maternal Grandmother    Diabetes Maternal Grandmother    Breast cancer Maternal Grandmother 35   Arthritis Maternal Grandfather    Hyperlipidemia Maternal Grandfather    Alcohol abuse Paternal Grandmother    Arthritis Paternal Grandmother    Hyperlipidemia Paternal Grandmother    Hypertension Paternal Grandmother    Mental illness Paternal Grandmother    Alcohol abuse Paternal Grandfather    Arthritis Paternal Grandfather    Hyperlipidemia Paternal Grandfather     Hyperlipidemia Maternal Aunt    Hypertension Maternal Aunt    Diabetes Maternal Aunt    Breast cancer Maternal Aunt 40   Deep vein thrombosis Maternal Aunt    Cancer Maternal Uncle    Mental illness Paternal Aunt    Alcohol abuse Paternal Uncle    Cancer Paternal Uncle    Colon cancer Neg Hx    Thyroid cancer Neg Hx     Allergies: Patient has no known allergies. Current Outpatient Medications on File Prior to Visit  Medication Sig Dispense Refill   amLODipine (NORVASC) 5 MG tablet TAKE 1 TABLET BY MOUTH  DAILY 90 tablet 3   calcium carbonate (TUMS - DOSED IN MG ELEMENTAL CALCIUM) 500 MG chewable tablet Chew 1 tablet by mouth as needed for indigestion or heartburn.     ibuprofen (ADVIL,MOTRIN) 200 MG tablet Take 400 mg by mouth every 6 (six) hours as needed for mild pain.     levothyroxine (SYNTHROID) 88 MCG tablet Take 88 mcg by mouth daily.     lisinopril (ZESTRIL) 10 MG tablet TAKE 1 AND 1/2 TABLETS(15 MG) BY MOUTH DAILY 135 tablet 1   metFORMIN (GLUCOPHAGE-XR) 500 MG 24 hr tablet Take 2 tablets (1,000 mg total) by mouth 2 (two) times daily. 120 tablet 3   Multiple Vitamins-Minerals (EQ MULTIVITAMINS ADULT GUMMY PO) Take 2 tablets by mouth daily.  rosuvastatin (CRESTOR) 10 MG tablet TAKE 1 TABLET(10 MG) BY MOUTH DAILY 90 tablet 3   RYBELSUS 7 MG TABS TAKE 1 TABLET BY MOUTH DAILY 90 tablet 1   No current facility-administered medications on file prior to visit.    Social History   Tobacco Use   Smoking status: Never   Smokeless tobacco: Never  Vaping Use   Vaping Use: Never used  Substance Use Topics   Alcohol use: Yes    Comment: RARE   Drug use: No    Review of Systems  Constitutional:  Negative for chills and fever.  Respiratory:  Negative for cough.   Cardiovascular:  Negative for chest pain and palpitations.  Gastrointestinal:  Negative for nausea and vomiting.      Objective:    BP 134/84 (BP Location: Left Arm, Patient Position: Sitting, Cuff Size: Large)    Pulse 81   Temp 98.3 F (36.8 C) (Oral)   Ht _0  (1.753 m)   Wt (!) 307 lb 12.8 oz (139.6 kg)   LMP  (LMP Unknown)   SpO2 97%   BMI 45.45 kg/m  BP Readings from Last 3 Encounters:  04/24/22 134/84  11/21/21 128/82  10/07/21 128/74   Wt Readings from Last 3 Encounters:  04/24/22 (!) 307 lb 12.8 oz (139.6 kg)  11/21/21 (!) 306 lb 14.4 oz (139.2 kg)  10/07/21 (!) 305 lb 6.4 oz (138.5 kg)    Physical Exam Vitals reviewed.  Constitutional:      Appearance: She is well-developed.  Eyes:     Conjunctiva/sclera: Conjunctivae normal.  Cardiovascular:     Rate and Rhythm: Normal rate and regular rhythm.     Pulses: Normal pulses.     Heart sounds: Normal heart sounds.  Pulmonary:     Effort: Pulmonary effort is normal.     Breath sounds: Normal breath sounds. No wheezing, rhonchi or rales.  Skin:    General: Skin is warm and dry.  Neurological:     Mental Status: She is alert.  Psychiatric:        Speech: Speech normal.        Behavior: Behavior normal.        Thought Content: Thought content normal.        Assessment & Plan:   Problem List Items Addressed This Visit       Cardiovascular and Mediastinum   HTN (hypertension)    Chronic, stable.  Continue amlodipine 5 mg, lisinopril 15 mg.        Endocrine   Diabetes mellitus without complication ([place]) - Primary    Lab Results  Component Value Date   HGBA1C 6.1 (A) 04/24/2022  Excellent control.  Continue metformin 1000 mg twice daily, Rybelsus 7 mg.  She is currently on lisinopril 15 mg due to proteinuria.  She politely declined recheck of urine protein  at today's visit and like to do this at follow-up.      Relevant Orders   POCT HgB A1C (Completed)     I am having Laurie Cameron maintain her Multiple Vitamins-Minerals (EQ MULTIVITAMINS ADULT GUMMY PO), ibuprofen, calcium carbonate, amLODipine, levothyroxine, Rybelsus, rosuvastatin, lisinopril, and metFORMIN.   No orders of the defined types were  placed in this encounter.   Return precautions given.   Risks, benefits, and alternatives of the medications and treatment plan prescribed today were discussed, and patient expressed understanding.   Education regarding symptom management and diagnosis given to patient on AVS.  Continue to follow with Mable Paris  G, FNP for routine health maintenance.   Ileene Hutchinson and I agreed with plan.   Mable Paris, FNP

## 2022-04-24 NOTE — Assessment & Plan Note (Signed)
Lab Results  Component Value Date   HGBA1C 6.1 (A) 04/24/2022   Excellent control.  Continue metformin 1000 mg twice daily, Rybelsus 7 mg.  She is currently on lisinopril 15 mg due to proteinuria.  She politely declined recheck of urine protein  at today's visit and like to do this at follow-up.

## 2022-04-24 NOTE — Assessment & Plan Note (Signed)
Chronic, stable.  Continue amlodipine 5 mg, lisinopril 15 mg. 

## 2022-04-24 NOTE — Progress Notes (Signed)
Has eye appt later in the year.

## 2022-06-04 ENCOUNTER — Other Ambulatory Visit: Payer: Self-pay | Admitting: Internal Medicine

## 2022-06-04 DIAGNOSIS — R030 Elevated blood-pressure reading, without diagnosis of hypertension: Secondary | ICD-10-CM

## 2022-07-14 ENCOUNTER — Other Ambulatory Visit: Payer: Self-pay

## 2022-07-14 MED ORDER — RYBELSUS 7 MG PO TABS: 1.0000 | ORAL_TABLET | Freq: Every day | ORAL | 1 refills | Status: DC

## 2022-07-18 ENCOUNTER — Other Ambulatory Visit: Payer: Self-pay

## 2022-08-16 ENCOUNTER — Other Ambulatory Visit: Payer: Self-pay

## 2022-08-16 MED ORDER — RYBELSUS 7 MG PO TABS: 1.0000 | ORAL_TABLET | Freq: Every day | ORAL | 1 refills | Status: DC

## 2022-08-26 ENCOUNTER — Other Ambulatory Visit: Payer: Self-pay | Admitting: Family

## 2022-08-26 DIAGNOSIS — E119 Type 2 diabetes mellitus without complications: Secondary | ICD-10-CM

## 2022-08-26 DIAGNOSIS — I10 Essential (primary) hypertension: Secondary | ICD-10-CM

## 2022-08-28 ENCOUNTER — Telehealth: Payer: Self-pay

## 2022-08-28 NOTE — Telephone Encounter (Signed)
LVM to see which pharmacy she would like her Lisinopril sent to.

## 2022-08-28 NOTE — Telephone Encounter (Signed)
Pt called back and stated she want the medication sent to [facility]

## 2022-08-28 NOTE — Telephone Encounter (Signed)
NOTED

## 2022-08-31 ENCOUNTER — Other Ambulatory Visit: Payer: Self-pay

## 2022-08-31 ENCOUNTER — Encounter: Payer: Self-pay | Admitting: Family

## 2022-08-31 DIAGNOSIS — E119 Type 2 diabetes mellitus without complications: Secondary | ICD-10-CM

## 2022-08-31 MED ORDER — METFORMIN HCL ER 500 MG PO TB24: 1000.0000 mg | ORAL_TABLET | Freq: Two times a day (BID) | ORAL | 3 refills | Status: DC

## 2022-08-31 MED ORDER — ROSUVASTATIN CALCIUM 10 MG PO TABS: ORAL_TABLET | ORAL | 3 refills | Status: DC

## 2022-08-31 NOTE — Telephone Encounter (Signed)
Spoke to pt and informed her that her rxs were sent in to [facility]

## 2022-10-25 ENCOUNTER — Ambulatory Visit: Payer: Managed Care, Other (non HMO) | Admitting: Family

## 2022-10-25 ENCOUNTER — Encounter: Payer: Self-pay | Admitting: Family

## 2022-10-25 VITALS — BP 130/78 | HR 83 | Temp 97.9°F | Ht 69.0 in | Wt 312.2 lb

## 2022-10-25 DIAGNOSIS — I10 Essential (primary) hypertension: Secondary | ICD-10-CM | POA: Diagnosis not present

## 2022-10-25 DIAGNOSIS — R7309 Other abnormal glucose: Secondary | ICD-10-CM

## 2022-10-25 DIAGNOSIS — E119 Type 2 diabetes mellitus without complications: Secondary | ICD-10-CM | POA: Diagnosis not present

## 2022-10-25 DIAGNOSIS — Z8639 Personal history of other endocrine, nutritional and metabolic disease: Secondary | ICD-10-CM

## 2022-10-25 LAB — POCT GLYCOSYLATED HEMOGLOBIN (HGB A1C): Hemoglobin A1C: 6.6 % — AB (ref 4.0–5.6)

## 2022-10-25 NOTE — Assessment & Plan Note (Signed)
Chronic, stable.  Continue amlodipine 5 mg, lisinopril 15 mg.

## 2022-10-25 NOTE — Progress Notes (Signed)
Assessment & Plan:  Diabetes mellitus without complication [facility]) Assessment & Plan: Lab Results  Component Value Date   HGBA1C 6.6 (A) 10/25/2022    Excellent control.  Continue metformin 1000 mg twice daily, Rybelsus 7 mg.  She is currently on lisinopril 15 mg due to proteinuria.  Pending microalbumin.  Orders: -     Microalbumin / creatinine urine ratio -     Comprehensive metabolic panel -     VITAMIN D 25 Hydroxy (Vit-D Deficiency, Fractures) -     Lipid panel  Elevated glucose -     POCT glycosylated hemoglobin (Hb A1C)  Hypertension, unspecified type Assessment & Plan: Chronic, stable.  Continue amlodipine 5 mg, lisinopril 15 mg.   History of vitamin D deficiency -     VITAMIN D 25 Hydroxy (Vit-D Deficiency, Fractures)     Return precautions given.   Risks, benefits, and alternatives of the medications and treatment plan prescribed today were discussed, and patient expressed understanding.   Education regarding symptom management and diagnosis given to patient on AVS either electronically or printed.  Return in about 3 months (around 01/23/2023).  Mable Paris, FNP  Subjective:    Patient ID: Eliese Kerwood, female    DOB: 12/22/1985, 37 y.o.   MRN: [id number]  CC: Tykeria Wawrzyniak is a 37 y.o. female who presents today for follow up.   HPI: Feels well today.  No new complaints.  She just had a trip to [place].  She is compliant with Rybelsus, metformin.  Tolerating both medications.    Allergies: Patient has no known allergies. Current Outpatient Medications on File Prior to Visit  Medication Sig Dispense Refill   amLODipine (NORVASC) 5 MG tablet TAKE 1 TABLET BY MOUTH  DAILY 90 tablet 3   calcium carbonate (TUMS - DOSED IN MG ELEMENTAL CALCIUM) 500 MG chewable tablet Chew 1 tablet by mouth as needed for indigestion or heartburn.     ibuprofen (ADVIL,MOTRIN) 200 MG tablet Take 400 mg by mouth every 6 (six) hours as needed for mild pain.      levothyroxine (SYNTHROID) 88 MCG tablet Take 88 mcg by mouth daily.     lisinopril (ZESTRIL) 10 MG tablet TAKE 1 AND 1/2 TABLETS BY MOUTH  DAILY 135 tablet 3   metFORMIN (GLUCOPHAGE-XR) 500 MG 24 hr tablet Take 2 tablets (1,000 mg total) by mouth 2 (two) times daily. 120 tablet 3   Multiple Vitamins-Minerals (EQ MULTIVITAMINS ADULT GUMMY PO) Take 2 tablets by mouth daily.     rosuvastatin (CRESTOR) 10 MG tablet TAKE 1 TABLET(10 MG) BY MOUTH DAILY 90 tablet 3   Semaglutide (RYBELSUS) 7 MG TABS Take 1 tablet by mouth daily. 90 tablet 1   No current facility-administered medications on file prior to visit.    Review of Systems  Constitutional:  Negative for chills and fever.  Respiratory:  Negative for cough.   Cardiovascular:  Negative for chest pain and palpitations.  Gastrointestinal:  Negative for nausea and vomiting.      Objective:    BP 130/78   Pulse 83   Temp 97.9 F (36.6 C) (Oral)   Ht 5\' 9"  (1.753 m)   Wt (!) 312 lb 3.2 oz (141.6 kg)   LMP  (LMP Unknown)   SpO2 99%   BMI 46.10 kg/m  BP Readings from Last 3 Encounters:  10/25/22 130/78  04/24/22 134/84  11/21/21 128/82   Wt Readings from Last 3 Encounters:  10/25/22 (!) 312 lb 3.2 oz (141.6 kg)  04/24/22 (!) 307 lb 12.8 oz (139.6 kg)  11/21/21 (!) 306 lb 14.4 oz (139.2 kg)    Physical Exam Vitals reviewed.  Constitutional:      Appearance: She is well-developed.  Eyes:     Conjunctiva/sclera: Conjunctivae normal.  Cardiovascular:     Rate and Rhythm: Normal rate and regular rhythm.     Pulses: Normal pulses.     Heart sounds: Normal heart sounds.  Pulmonary:     Effort: Pulmonary effort is normal.     Breath sounds: Normal breath sounds. No wheezing, rhonchi or rales.  Skin:    General: Skin is warm and dry.  Neurological:     Mental Status: She is alert.  Psychiatric:        Speech: Speech normal.        Behavior: Behavior normal.        Thought Content: Thought content normal.

## 2022-10-25 NOTE — Assessment & Plan Note (Signed)
Lab Results  Component Value Date   HGBA1C 6.6 (A) 10/25/2022    Excellent control.  Continue metformin 1000 mg twice daily, Rybelsus 7 mg.  She is currently on lisinopril 15 mg due to proteinuria.  Pending microalbumin.

## 2022-11-01 ENCOUNTER — Other Ambulatory Visit: Payer: Self-pay | Admitting: Family

## 2022-11-01 DIAGNOSIS — E119 Type 2 diabetes mellitus without complications: Secondary | ICD-10-CM

## 2023-01-24 ENCOUNTER — Ambulatory Visit: Payer: Managed Care, Other (non HMO) | Admitting: Family

## 2023-02-07 ENCOUNTER — Other Ambulatory Visit: Payer: Self-pay | Admitting: Family

## 2023-03-21 ENCOUNTER — Other Ambulatory Visit: Payer: Self-pay | Admitting: Family

## 2023-03-22 LAB — MICROALBUMIN / CREATININE URINE RATIO
Creatinine, Urine: 21.2 mg/dL
Microalb/Creat Ratio: 46 mg/g creat — ABNORMAL HIGH (ref 0–29)
Microalbumin, Urine: 9.7 ug/mL

## 2023-03-22 LAB — LIPID PANEL
Chol/HDL Ratio: 2.6 ratio (ref 0.0–4.4)
Cholesterol, Total: 138 mg/dL (ref 100–199)
HDL: 54 mg/dL (ref >39)
LDL Chol Calc (NIH): 49 mg/dL (ref 0–99)
Triglycerides: 223 mg/dL — ABNORMAL HIGH (ref 0–149)
VLDL Cholesterol Cal: 35 mg/dL (ref 5–40)

## 2023-03-22 LAB — COMPREHENSIVE METABOLIC PANEL
ALT: 16 IU/L (ref 0–32)
AST: 30 IU/L (ref 0–40)
Albumin: 4.4 g/dL (ref 3.9–4.9)
Alkaline Phosphatase: 46 IU/L (ref 44–121)
BUN/Creatinine Ratio: 16 (ref 9–23)
BUN: 12 mg/dL (ref 6–20)
Bilirubin Total: 0.6 mg/dL (ref 0.0–1.2)
CO2: 22 mmol/L (ref 20–29)
Calcium: 9.5 mg/dL (ref 8.7–10.2)
Chloride: 99 mmol/L (ref 96–106)
Creatinine, Ser: 0.74 mg/dL (ref 0.57–1.00)
Globulin, Total: 2.2 g/dL (ref 1.5–4.5)
Glucose: 139 mg/dL — ABNORMAL HIGH (ref 70–99)
Potassium: 4.9 mmol/L (ref 3.5–5.2)
Sodium: 136 mmol/L (ref 134–144)
Total Protein: 6.6 g/dL (ref 6.0–8.5)
eGFR: 106 mL/min/{1.73_m2} (ref >59)

## 2023-03-22 LAB — VITAMIN D 25 HYDROXY (VIT D DEFICIENCY, FRACTURES): Vit D, 25-Hydroxy: 23.6 ng/mL — ABNORMAL LOW (ref 30.0–100.0)

## 2023-03-23 ENCOUNTER — Other Ambulatory Visit: Payer: Self-pay

## 2023-03-23 ENCOUNTER — Ambulatory Visit: Payer: Managed Care, Other (non HMO) | Admitting: Family

## 2023-03-23 VITALS — BP 122/68 | HR 88 | Temp 98.2°F | Ht 69.0 in | Wt 315.0 lb

## 2023-03-23 DIAGNOSIS — I10 Essential (primary) hypertension: Secondary | ICD-10-CM

## 2023-03-23 DIAGNOSIS — E119 Type 2 diabetes mellitus without complications: Secondary | ICD-10-CM | POA: Diagnosis not present

## 2023-03-23 DIAGNOSIS — Z7984 Long term (current) use of oral hypoglycemic drugs: Secondary | ICD-10-CM | POA: Diagnosis not present

## 2023-03-23 LAB — POCT GLYCOSYLATED HEMOGLOBIN (HGB A1C): Hemoglobin A1C: 6.5 % — AB (ref 4.0–5.6)

## 2023-03-23 MED ORDER — LISINOPRIL 20 MG PO TABS: ORAL_TABLET | ORAL | 1 refills | Status: DC

## 2023-03-23 NOTE — Progress Notes (Signed)
Assessment & Plan:  Diabetes mellitus without complication (HCC) Assessment & Plan: Excellent control.  Continue metformin 1000 mg twice daily, Rybelsus 7 mg daily  Orders: -     POCT glycosylated hemoglobin (Hb A1C)  Hypertension, unspecified type Assessment & Plan: Blood pressure well-controlled.  Increasing lisinopril to 20 mg every day in the setting of microalbuminemia.  Continue amlodipine 5 mg qd      Return precautions given.   Risks, benefits, and alternatives of the medications and treatment plan prescribed today were discussed, and patient expressed understanding.   Education regarding symptom management and diagnosis given to patient on AVS either electronically or printed.  Return in about 3 months (around 06/23/2023).  [name], FNP  Subjective:    Patient ID: Laurie Cameron, female    DOB: April 11, 1986, 37 y.o.   MRN: [id number]  CC: Laurie Cameron is a 37 y.o. female who presents today for follow up.   HPI: Feels well today.  No new complaints.  She is tolerating Rybelsus and metformin well.  She is compliant with Crestor   She is agreeable to increase lisinopril to 20mg  every day.   Allergies: Patient has no known allergies. Current Outpatient Medications on File Prior to Visit  Medication Sig Dispense Refill   amLODipine (NORVASC) 5 MG tablet TAKE 1 TABLET BY MOUTH  DAILY 90 tablet 3   calcium carbonate (TUMS - DOSED IN MG ELEMENTAL CALCIUM) 500 MG chewable tablet Chew 1 tablet by mouth as needed for indigestion or heartburn.     ibuprofen (ADVIL,MOTRIN) 200 MG tablet Take 400 mg by mouth every 6 (six) hours as needed for mild pain.     levothyroxine (SYNTHROID) 88 MCG tablet Take 88 mcg by mouth daily.     metFORMIN (GLUCOPHAGE-XR) 500 MG 24 hr tablet TAKE 2 TABLETS BY MOUTH TWICE  DAILY 360 tablet 3   Multiple Vitamins-Minerals (EQ MULTIVITAMINS ADULT GUMMY PO) Take 2 tablets by mouth daily.     rosuvastatin (CRESTOR) 10 MG tablet TAKE 1 TABLET(10  MG) BY MOUTH DAILY 90 tablet 3   RYBELSUS 7 MG TABS TAKE 1 TABLET BY MOUTH DAILY 90 tablet 3   No current facility-administered medications on file prior to visit.    Review of Systems  Constitutional:  Negative for chills and fever.  Respiratory:  Negative for cough.   Cardiovascular:  Negative for chest pain and palpitations.  Gastrointestinal:  Negative for nausea and vomiting.      Objective:    BP 122/68   Pulse 88   Temp 98.2 F (36.8 C) (Temporal)   Ht 5\' 9"  (1.753 m)   Wt (!) 315 lb (142.9 kg)   LMP 03/14/2023 (Exact Date)   SpO2 98%   BMI 46.52 kg/m  BP Readings from Last 3 Encounters:  03/23/23 122/68  10/25/22 130/78  04/24/22 134/84   Wt Readings from Last 3 Encounters:  03/23/23 (!) 315 lb (142.9 kg)  10/25/22 (!) 312 lb 3.2 oz (141.6 kg)  04/24/22 (!) 307 lb 12.8 oz (139.6 kg)    Physical Exam Vitals reviewed.  Constitutional:      Appearance: She is well-developed.  Eyes:     Conjunctiva/sclera: Conjunctivae normal.  Cardiovascular:     Rate and Rhythm: Normal rate and regular rhythm.     Pulses: Normal pulses.     Heart sounds: Normal heart sounds.  Pulmonary:     Effort: Pulmonary effort is normal.     Breath sounds: Normal breath sounds. No wheezing,  rhonchi or rales.  Skin:    General: Skin is warm and dry.  Neurological:     Mental Status: She is alert.  Psychiatric:        Speech: Speech normal.        Behavior: Behavior normal.        Thought Content: Thought content normal.

## 2023-03-23 NOTE — Assessment & Plan Note (Signed)
Blood pressure well-controlled.  Increasing lisinopril to 20 mg every day in the setting of microalbuminemia.  Continue amlodipine 5 mg qd

## 2023-03-23 NOTE — Assessment & Plan Note (Signed)
Excellent control.  Continue metformin 1000 mg twice daily, Rybelsus 7 mg daily

## 2023-04-04 ENCOUNTER — Encounter: Payer: Self-pay | Admitting: Family

## 2023-04-12 LAB — BASIC METABOLIC PANEL
BUN/Creatinine Ratio: 18 (ref 9–23)
BUN: 12 mg/dL (ref 6–20)
CO2: 24 mmol/L (ref 20–29)
Calcium: 9.8 mg/dL (ref 8.7–10.2)
Chloride: 101 mmol/L (ref 96–106)
Creatinine, Ser: 0.66 mg/dL (ref 0.57–1.00)
Glucose: 157 mg/dL — ABNORMAL HIGH (ref 70–99)
Potassium: 4.7 mmol/L (ref 3.5–5.2)
Sodium: 138 mmol/L (ref 134–144)
eGFR: 115 mL/min/{1.73_m2} (ref >59)

## 2023-04-21 ENCOUNTER — Other Ambulatory Visit: Payer: Self-pay | Admitting: Family

## 2023-04-21 DIAGNOSIS — R030 Elevated blood-pressure reading, without diagnosis of hypertension: Secondary | ICD-10-CM

## 2023-06-20 ENCOUNTER — Ambulatory Visit: Payer: Managed Care, Other (non HMO) | Admitting: Family

## 2023-06-20 ENCOUNTER — Encounter: Payer: Self-pay | Admitting: Family

## 2023-06-20 VITALS — BP 110/70 | HR 88 | Temp 98.0°F | Resp 17 | Ht 69.0 in | Wt 309.5 lb

## 2023-06-20 DIAGNOSIS — R030 Elevated blood-pressure reading, without diagnosis of hypertension: Secondary | ICD-10-CM | POA: Diagnosis not present

## 2023-06-20 DIAGNOSIS — R7309 Other abnormal glucose: Secondary | ICD-10-CM

## 2023-06-20 DIAGNOSIS — I10 Essential (primary) hypertension: Secondary | ICD-10-CM

## 2023-06-20 DIAGNOSIS — E039 Hypothyroidism, unspecified: Secondary | ICD-10-CM | POA: Diagnosis not present

## 2023-06-20 DIAGNOSIS — E119 Type 2 diabetes mellitus without complications: Secondary | ICD-10-CM

## 2023-06-20 DIAGNOSIS — Z7984 Long term (current) use of oral hypoglycemic drugs: Secondary | ICD-10-CM

## 2023-06-20 LAB — POCT GLYCOSYLATED HEMOGLOBIN (HGB A1C): Hemoglobin A1C: 6.4 % — AB (ref 4.0–5.6)

## 2023-06-20 MED ORDER — ROSUVASTATIN CALCIUM 10 MG PO TABS: ORAL_TABLET | ORAL | 3 refills | Status: DC

## 2023-06-20 MED ORDER — METFORMIN HCL ER 500 MG PO TB24: 1000.0000 mg | ORAL_TABLET | Freq: Two times a day (BID) | ORAL | 3 refills | Status: DC

## 2023-06-20 MED ORDER — AMLODIPINE BESYLATE 5 MG PO TABS: 5.0000 mg | ORAL_TABLET | Freq: Every day | ORAL | 3 refills | Status: DC

## 2023-06-20 NOTE — Assessment & Plan Note (Signed)
Lab Results  Component Value Date   HGBA1C 6.4 (A) 06/20/2023   Excellent control.  Continue metformin 1000 mg twice daily, Rybelsus 7 mg daily

## 2023-06-20 NOTE — Progress Notes (Signed)
Assessment & Plan:  Diabetes mellitus without complication [facility]) Assessment & Plan: Lab Results  Component Value Date   HGBA1C 6.4 (A) 06/20/2023   Excellent control.  Continue metformin 1000 mg twice daily, Rybelsus 7 mg daily  Orders: -     POCT glycosylated hemoglobin (Hb A1C) -     metFORMIN HCl ER; Take 2 tablets (1,000 mg total) by mouth 2 (two) times daily.  Dispense: 360 tablet; Refill: 3 -     Rosuvastatin Calcium; TAKE 1 TABLET(10 MG) BY MOUTH DAILY  Dispense: 90 tablet; Refill: 3 -     Urine Microscopic -     Microalbumin / creatinine urine ratio  Hypertension, unspecified type Assessment & Plan: Blood pressure well-controlled.  Continue lisinopril to 20 mg every day in the setting of microalbuminemia.  Continue amlodipine 5 mg qd  Orders: -     amLODIPine Besylate; Take 1 tablet (5 mg total) by mouth daily.  Dispense: 90 tablet; Refill: 3 -     Urine Microscopic  Hypothyroidism, unspecified type  Elevated blood pressure reading -     amLODIPine Besylate; Take 1 tablet (5 mg total) by mouth daily.  Dispense: 90 tablet; Refill: 3     Return precautions given.   Risks, benefits, and alternatives of the medications and treatment plan prescribed today were discussed, and patient expressed understanding.   Education regarding symptom management and diagnosis given to patient on AVS either electronically or printed.  Return in about 4 months (around 10/20/2023).  [name], FNP  Subjective:    Patient ID: Laurie Cameron, female    DOB: May 07, 1986, 38 y.o.   MRN: [id number]  CC: Laurie Cameron is a 37 y.o. female who presents today for follow up.   HPI: Feels well today, no new complaints.  Tolerating increased dose of lisinopril 20 mg daily.  Denies dizziness    Allergies: Patient has no known allergies. Current Outpatient Medications on File Prior to Visit  Medication Sig Dispense Refill   calcium carbonate (TUMS - DOSED IN MG ELEMENTAL CALCIUM) 500 MG  chewable tablet Chew 1 tablet by mouth as needed for indigestion or heartburn.     ibuprofen (ADVIL,MOTRIN) 200 MG tablet Take 400 mg by mouth every 6 (six) hours as needed for mild pain.     levothyroxine (SYNTHROID) 100 MCG tablet Take 100 mcg by mouth daily.     lisinopril (ZESTRIL) 20 MG tablet TAKE 1 TABLET BY MOUTH  DAILY 90 tablet 1   Multiple Vitamins-Minerals (EQ MULTIVITAMINS ADULT GUMMY PO) Take 2 tablets by mouth daily.     RYBELSUS 7 MG TABS TAKE 1 TABLET BY MOUTH DAILY 90 tablet 3   No current facility-administered medications on file prior to visit.    Review of Systems  Constitutional:  Negative for chills and fever.  Respiratory:  Negative for cough.   Cardiovascular:  Negative for chest pain and palpitations.  Gastrointestinal:  Negative for nausea and vomiting.      Objective:    BP 110/70 (BP Location: Left Arm, Patient Position: Sitting, Cuff Size: Large)   Pulse 88   Temp 98 F (36.7 C) (Oral)   Resp 17   Ht 5\' 9"  (1.753 m)   Wt (!) 309 lb 8 oz (140.4 kg)   SpO2 99%   BMI 45.71 kg/m  BP Readings from Last 3 Encounters:  06/20/23 110/70  03/23/23 122/68  10/25/22 130/78   Wt Readings from Last 3 Encounters:  06/20/23 (!) 309 lb 8 oz (  140.4 kg)  03/23/23 (!) 315 lb (142.9 kg)  10/25/22 (!) 312 lb 3.2 oz (141.6 kg)    Physical Exam Vitals reviewed.  Constitutional:      Appearance: She is well-developed.  Eyes:     Conjunctiva/sclera: Conjunctivae normal.  Cardiovascular:     Rate and Rhythm: Normal rate and regular rhythm.     Pulses: Normal pulses.     Heart sounds: Normal heart sounds.  Pulmonary:     Effort: Pulmonary effort is normal.     Breath sounds: Normal breath sounds. No wheezing, rhonchi or rales.  Skin:    General: Skin is warm and dry.  Neurological:     Mental Status: She is alert.  Psychiatric:        Speech: Speech normal.        Behavior: Behavior normal.        Thought Content: Thought content normal.

## 2023-06-20 NOTE — Assessment & Plan Note (Signed)
Blood pressure well-controlled.  Continue lisinopril to 20 mg every day in the setting of microalbuminemia.  Continue amlodipine 5 mg qd

## 2023-06-25 ENCOUNTER — Ambulatory Visit: Payer: Managed Care, Other (non HMO) | Admitting: Family

## 2023-07-10 LAB — URINALYSIS, MICROSCOPIC ONLY: Bacteria, UA: NONE SEEN

## 2023-07-11 ENCOUNTER — Telehealth: Payer: Self-pay | Admitting: Family

## 2023-07-11 NOTE — Telephone Encounter (Signed)
Add on sheet faxed, but the urine specimen was collected on 10/14. Not sure if it can added yet.

## 2023-07-11 NOTE — Telephone Encounter (Signed)
[name], Laurie Cameron,   Can we add on [facility] microalbumin urine for this patient's urine?

## 2023-07-11 NOTE — Addendum Note (Signed)
Addended by: Warden Fillers on: 07/11/2023 10:08 AM   Modules accepted: Orders

## 2023-07-12 LAB — MICROALBUMIN / CREATININE URINE RATIO
Creatinine, Urine: 207.8 mg/dL
Microalb/Creat Ratio: 29 mg/g{creat} (ref 0–29)
Microalbumin, Urine: 60.2 ug/mL

## 2023-07-12 LAB — SPECIMEN STATUS REPORT

## 2023-07-13 ENCOUNTER — Encounter: Payer: Self-pay | Admitting: Family

## 2023-07-13 NOTE — Addendum Note (Signed)
Addended by: [place], Zakee Deerman on: 07/13/2023 01:42 PM   Modules accepted: Orders

## 2023-07-24 ENCOUNTER — Telehealth: Payer: Self-pay

## 2023-07-24 NOTE — Telephone Encounter (Signed)
Lvm for pt to give office a call back see message below

## 2023-07-24 NOTE — Telephone Encounter (Signed)
-----   Message from [name] sent at 07/24/2023  6:36 AM EDT ----- Call patient Patient has not viewed MyChart result note.  She needs to have UA done at [facility] to ensure blood has resolved.  Please ask her to have done in the next week if she can  Please let me know if questions

## 2023-07-25 ENCOUNTER — Encounter: Payer: Self-pay | Admitting: *Deleted

## 2023-08-04 LAB — URINALYSIS, ROUTINE W REFLEX MICROSCOPIC
Bilirubin, UA: NEGATIVE
Ketones, UA: NEGATIVE
Leukocytes,UA: NEGATIVE
Nitrite, UA: NEGATIVE
Protein,UA: NEGATIVE
RBC, UA: NEGATIVE
Specific Gravity, UA: 1.023 (ref 1.005–1.030)
Urobilinogen, Ur: 0.2 mg/dL (ref 0.2–1.0)
pH, UA: 6 (ref 5.0–7.5)

## 2023-08-05 LAB — URINE CULTURE

## 2023-10-07 ENCOUNTER — Other Ambulatory Visit: Payer: Self-pay | Admitting: Family

## 2023-10-07 DIAGNOSIS — E119 Type 2 diabetes mellitus without complications: Secondary | ICD-10-CM

## 2023-10-07 DIAGNOSIS — I10 Essential (primary) hypertension: Secondary | ICD-10-CM

## 2023-10-25 ENCOUNTER — Ambulatory Visit: Payer: Managed Care, Other (non HMO) | Admitting: Family

## 2023-11-09 ENCOUNTER — Ambulatory Visit: Payer: Managed Care, Other (non HMO) | Admitting: Family

## 2023-11-09 ENCOUNTER — Telehealth: Payer: Self-pay | Admitting: Family

## 2023-11-09 ENCOUNTER — Encounter: Payer: Self-pay | Admitting: Family

## 2023-11-09 VITALS — BP 122/78 | HR 93 | Temp 98.2°F | Ht 69.0 in | Wt 314.6 lb

## 2023-11-09 DIAGNOSIS — R7309 Other abnormal glucose: Secondary | ICD-10-CM

## 2023-11-09 DIAGNOSIS — I10 Essential (primary) hypertension: Secondary | ICD-10-CM | POA: Diagnosis not present

## 2023-11-09 DIAGNOSIS — E119 Type 2 diabetes mellitus without complications: Secondary | ICD-10-CM

## 2023-11-09 DIAGNOSIS — N920 Excessive and frequent menstruation with regular cycle: Secondary | ICD-10-CM | POA: Diagnosis not present

## 2023-11-09 DIAGNOSIS — Z7984 Long term (current) use of oral hypoglycemic drugs: Secondary | ICD-10-CM | POA: Diagnosis not present

## 2023-11-09 LAB — POCT GLYCOSYLATED HEMOGLOBIN (HGB A1C): Hemoglobin A1C: 6.8 % — AB (ref 4.0–5.6)

## 2023-11-09 NOTE — Assessment & Plan Note (Signed)
Lab Results  Component Value Date   HGBA1C 6.8 (A) 11/09/2023   Excellent control.  Continue metformin 1000 mg twice daily, Rybelsus 7 mg daily

## 2023-11-09 NOTE — Telephone Encounter (Signed)
Lft pt vm to call ofc to sch [place]. thanks ?

## 2023-11-09 NOTE — Assessment & Plan Note (Signed)
Blood pressure well-controlled.  Continue lisinopril to 20 mg every day in the setting of microalbuminemia.  Continue amlodipine 5 mg qd

## 2023-11-09 NOTE — Progress Notes (Signed)
 Assessment & Plan:  Hypertension, unspecified type Assessment & Plan: Blood pressure well-controlled.  Continue lisinopril to 20 mg every day in the setting of microalbuminemia.  Continue amlodipine 5 mg qd  Orders: -     Comprehensive metabolic panel  Elevated glucose -     POCT glycosylated hemoglobin (Hb A1C)  Menorrhagia with regular cycle Assessment & Plan: Continues to have intermenstrual bleeding.  Pending labs, transvaginal ultrasound. Consider OCP.  Orders: -     CBC with Differential/Platelet -     Iron, TIBC and Ferritin Panel -     Comprehensive metabolic panel -     US PELVIC COMPLETE WITH TRANSVAGINAL; Future -     TSH  Diabetes mellitus without complication [facility]) Assessment & Plan: Lab Results  Component Value Date   HGBA1C 6.8 (A) 11/09/2023   Excellent control.  Continue metformin 1000 mg twice daily, Rybelsus 7 mg daily      Return precautions given.   Risks, benefits, and alternatives of the medications and treatment plan prescribed today were discussed, and patient expressed understanding.   Education regarding symptom management and diagnosis given to patient on AVS either electronically or printed.  Return in about 3 months (around 02/06/2024) for Complete Physical Exam.  [name], FNP  Subjective:    Patient ID: Laurie Cameron, female    DOB: 06-14-1986, 38 y.o.   MRN: [id number]  CC: Laurie Cameron is a 38 y.o. female who presents today for follow up.   HPI: Complains of heavy menstrual bleeding approximately 3 weeks ago.  This is abnormal for her and has never occurred in the past.  She reports 3 days of very heavy bleeding , changing super maxi pad every hour.  She had dull cramping to sharp stabbing pains at times.  After the 3 days, bleeding was lighter and she has been spotting since.  She has been wearing a panty liner.  Denies dysuria, fever, abdominal pain.  Her mother had a history of fibroids.  No family history of  endometriosis.  She is tolerating Rybelsus without nausea, constipation.  She is wearing a smart watch on average reports heart rate around 70.  Denies chest pain, palpitations\     Pap smear negative malignancy, HPV 10/18/2020  Allergies: Patient has no known allergies. Current Outpatient Medications on File Prior to Visit  Medication Sig Dispense Refill   amLODipine (NORVASC) 5 MG tablet Take 1 tablet (5 mg total) by mouth daily. 90 tablet 3   calcium carbonate (TUMS - DOSED IN MG ELEMENTAL CALCIUM) 500 MG chewable tablet Chew 1 tablet by mouth as needed for indigestion or heartburn.     ibuprofen (ADVIL,MOTRIN) 200 MG tablet Take 400 mg by mouth every 6 (six) hours as needed for mild pain.     levothyroxine (SYNTHROID) 100 MCG tablet Take 100 mcg by mouth daily.     lisinopril (ZESTRIL) 20 MG tablet TAKE 1 TABLET BY MOUTH DAILY 90 tablet 3   metFORMIN (GLUCOPHAGE-XR) 500 MG 24 hr tablet Take 2 tablets (1,000 mg total) by mouth 2 (two) times daily. 360 tablet 3   Multiple Vitamins-Minerals (EQ MULTIVITAMINS ADULT GUMMY PO) Take 2 tablets by mouth daily.     rosuvastatin (CRESTOR) 10 MG tablet TAKE 1 TABLET(10 MG) BY MOUTH DAILY 90 tablet 3   RYBELSUS 7 MG TABS TAKE 1 TABLET BY MOUTH DAILY 90 tablet 3   No current facility-administered medications on file prior to visit.    Review of Systems  Constitutional:  Negative for chills and fever.  Respiratory:  Negative for cough.   Cardiovascular:  Negative for chest pain and palpitations.  Gastrointestinal:  Negative for abdominal pain, nausea and vomiting.  Genitourinary:  Positive for vaginal bleeding.      Objective:    BP 122/78   Pulse 93   Temp 98.2 F (36.8 C) (Oral)   Ht 5\' 9"  (1.753 m)   Wt (!) 314 lb 9.6 oz (142.7 kg)   LMP 10/22/2023 (Exact Date)   SpO2 97%   BMI 46.46 kg/m  BP Readings from Last 3 Encounters:  11/09/23 122/78  06/20/23 110/70  03/23/23 122/68   Wt Readings from Last 3 Encounters:  11/09/23  (!) 314 lb 9.6 oz (142.7 kg)  06/20/23 (!) 309 lb 8 oz (140.4 kg)  03/23/23 (!) 315 lb (142.9 kg)    Physical Exam Vitals reviewed.  Constitutional:      Appearance: She is well-developed.  Eyes:     Conjunctiva/sclera: Conjunctivae normal.  Neck:     Thyroid: No thyroid mass, thyromegaly or thyroid tenderness.  Cardiovascular:     Rate and Rhythm: Normal rate and regular rhythm.     Pulses: Normal pulses.     Heart sounds: Normal heart sounds.  Pulmonary:     Effort: Pulmonary effort is normal.     Breath sounds: Normal breath sounds. No wheezing, rhonchi or rales.  Skin:    General: Skin is warm and dry.  Neurological:     Mental Status: She is alert.  Psychiatric:        Speech: Speech normal.        Behavior: Behavior normal.        Thought Content: Thought content normal.

## 2023-11-13 NOTE — Assessment & Plan Note (Signed)
 Continues to have intermenstrual bleeding.  Pending labs, transvaginal ultrasound. Consider OCP.

## 2023-11-13 NOTE — Patient Instructions (Signed)
I have ordered transvaginal ultrasound Let us know if you dont hear back within a week in regards to an appointment being scheduled.   So that you are aware, if you are [facility] MyChart user , please pay attention to your MyChart messages as you may receive a MyChart message with a phone number to call and schedule this test/appointment own your own from our referral coordinator. This is a new process so I do not want you to miss this message.  If you are not a MyChart user, you will receive a phone call.    Trial of Aygestin.   Abnormal Uterine Bleeding Abnormal uterine bleeding means bleeding more than normal from your womb (uterus). It can include: Bleeding after sex. Bleeding between monthly (menstrual) periods. Bleeding that is heavier than normal. Monthly periods that last longer than normal. Bleeding after you have stopped having your monthly period (menopause). You should see a doctor for any kind of bleeding that is not normal. Treatment depends on the cause of your bleeding and how much you bleed. Follow these instructions at home: Medicines Take over-the-counter and prescription medicines only as told by your doctor. Ask your doctor about: Taking medicines such as aspirin and ibuprofen. Do not take these medicines unless your doctor tells you to take them. Taking over-the-counter medicines, vitamins, herbs, and supplements. You may be given iron pills. Take them as told by your doctor. Managing constipation If you take iron pills, you may need to take these actions to prevent or treat trouble pooping (constipation): Drink enough fluid to keep your pee (urine) pale yellow. Take over-the-counter or prescription medicines. Eat foods that are high in fiber. These include beans, whole grains, and fresh fruits and vegetables. Limit foods that are high in fat and sugar. These include fried or sweet foods. Activity Change your activity to decrease bleeding if you need to change your  sanitary pad more than one time every 2 hours: Lie in bed with your feet raised (elevated). Place a cold pack on your lower belly. Rest as much as you are able until the bleeding stops or slows down. General instructions Do not use tampons, douche, or have sex until your doctor says these things are okay. Change your pads often. Get regular exams. These include: Pelvic exams. Screenings for cancer of the cervix. It is up to you to get the results of any tests that are done. Ask how to get your results when they are ready. Watch for any changes in your bleeding. For 2 months, write down: When your monthly period starts. When your monthly period ends. When you get any abnormal bleeding from your vagina. What problems you notice. Keep all follow-up visits. Contact a doctor if: The bleeding lasts more than one week. You feel dizzy at times. You feel like you may vomit (nausea). You vomit. You feel light-headed or weak. Your symptoms get worse. Get help right away if: You faint. You have to change pads every hour. You have pain in your belly. You have a fever or chills. You get sweaty or weak. You pass large blood clots from your vagina. These symptoms may be an emergency. Get help right away. Call your local emergency services (911 in the [place] Do not wait to see if the symptoms will go away. Do not drive yourself to the hospital. Summary Abnormal uterine bleeding means bleeding more than normal from your womb (uterus). Any kind of bleeding that is not normal should be checked by a doctor. Treatment  depends on the cause of your bleeding and how much you bleed. Get help right away if you faint, you have to change pads every hour, or you pass large blood clots from your vagina. This information is not intended to replace advice given to you by your health care provider. Make sure you discuss any questions you have with your health care provider. Document Revised: 01/11/2021  Document Reviewed: 01/11/2021 Elsevier Patient Education  [place].

## 2023-11-20 ENCOUNTER — Ambulatory Visit
Admission: RE | Admit: 2023-11-20 | Discharge: 2023-11-20 | Disposition: A | Discharge status: Home / Self Care | Payer: Managed Care, Other (non HMO) | Source: Ambulatory Visit | Attending: Family | Admitting: Family

## 2023-11-20 DIAGNOSIS — N920 Excessive and frequent menstruation with regular cycle: Secondary | ICD-10-CM | POA: Insufficient documentation

## 2023-11-21 LAB — COMPREHENSIVE METABOLIC PANEL
ALT: 16 IU/L (ref 0–32)
AST: 46 IU/L — ABNORMAL HIGH (ref 0–40)
Albumin: 4.6 g/dL (ref 3.9–4.9)
Alkaline Phosphatase: 53 IU/L (ref 44–121)
BUN/Creatinine Ratio: 13 (ref 9–23)
BUN: 10 mg/dL (ref 6–20)
Bilirubin Total: 0.6 mg/dL (ref 0.0–1.2)
CO2: 21 mmol/L (ref 20–29)
Calcium: 9.5 mg/dL (ref 8.7–10.2)
Chloride: 99 mmol/L (ref 96–106)
Creatinine, Ser: 0.79 mg/dL (ref 0.57–1.00)
Globulin, Total: 2.3 g/dL (ref 1.5–4.5)
Glucose: 187 mg/dL — ABNORMAL HIGH (ref 70–99)
Potassium: 5.3 mmol/L — ABNORMAL HIGH (ref 3.5–5.2)
Sodium: 137 mmol/L (ref 134–144)
Total Protein: 6.9 g/dL (ref 6.0–8.5)
eGFR: 98 mL/min/{1.73_m2} (ref >59)

## 2023-11-21 LAB — CBC WITH DIFFERENTIAL/PLATELET
Basophils Absolute: 0.1 10*3/uL (ref 0.0–0.2)
Basos: 1 %
EOS (ABSOLUTE): 0.3 10*3/uL (ref 0.0–0.4)
Eos: 3 %
Hematocrit: 33.2 % — ABNORMAL LOW (ref 34.0–46.6)
Hemoglobin: 10.1 g/dL — ABNORMAL LOW (ref 11.1–15.9)
Immature Grans (Abs): 0 10*3/uL (ref 0.0–0.1)
Immature Granulocytes: 0 %
Lymphocytes Absolute: 2.4 10*3/uL (ref 0.7–3.1)
Lymphs: 29 %
MCH: 25.8 pg — ABNORMAL LOW (ref 26.6–33.0)
MCHC: 30.4 g/dL — ABNORMAL LOW (ref 31.5–35.7)
MCV: 85 fL (ref 79–97)
Monocytes Absolute: 0.5 10*3/uL (ref 0.1–0.9)
Monocytes: 6 %
Neutrophils Absolute: 5 10*3/uL (ref 1.4–7.0)
Neutrophils: 61 %
Platelets: 316 10*3/uL (ref 150–450)
RBC: 3.91 x10E6/uL (ref 3.77–5.28)
RDW: 14.4 % (ref 11.7–15.4)
WBC: 8.2 10*3/uL (ref 3.4–10.8)

## 2023-11-21 LAB — IRON,TIBC AND FERRITIN PANEL
Ferritin: 24 ng/mL (ref 15–150)
Iron Saturation: 10 % — ABNORMAL LOW (ref 15–55)
Iron: 42 ug/dL (ref 27–159)
Total Iron Binding Capacity: 433 ug/dL (ref 250–450)
UIBC: 391 ug/dL (ref 131–425)

## 2023-11-21 LAB — TSH: TSH: 4.42 u[IU]/mL (ref 0.450–4.500)

## 2023-11-25 ENCOUNTER — Encounter: Payer: Self-pay | Admitting: Family

## 2023-11-25 ENCOUNTER — Other Ambulatory Visit: Payer: Self-pay | Admitting: Family

## 2023-11-25 DIAGNOSIS — R899 Unspecified abnormal finding in specimens from other organs, systems and tissues: Secondary | ICD-10-CM

## 2023-11-26 NOTE — Telephone Encounter (Signed)
 Noted.

## 2023-12-05 ENCOUNTER — Encounter: Payer: Self-pay | Admitting: Family

## 2023-12-05 LAB — BASIC METABOLIC PANEL
BUN/Creatinine Ratio: 22 (ref 9–23)
BUN: 16 mg/dL (ref 6–20)
CO2: 23 mmol/L (ref 20–29)
Calcium: 10.2 mg/dL (ref 8.7–10.2)
Chloride: 99 mmol/L (ref 96–106)
Creatinine, Ser: 0.73 mg/dL (ref 0.57–1.00)
Glucose: 154 mg/dL — ABNORMAL HIGH (ref 70–99)
Potassium: 5.1 mmol/L (ref 3.5–5.2)
Sodium: 137 mmol/L (ref 134–144)
eGFR: 108 mL/min/{1.73_m2} (ref >59)

## 2024-02-02 ENCOUNTER — Other Ambulatory Visit: Payer: Self-pay

## 2024-02-02 ENCOUNTER — Emergency Department: Admission: EM | Admit: 2024-02-02 | Discharge: 2024-02-02 | Disposition: A | Discharge status: Home / Self Care

## 2024-02-02 DIAGNOSIS — D649 Anemia, unspecified: Secondary | ICD-10-CM | POA: Insufficient documentation

## 2024-02-02 DIAGNOSIS — N939 Abnormal uterine and vaginal bleeding, unspecified: Secondary | ICD-10-CM | POA: Diagnosis present

## 2024-02-02 DIAGNOSIS — E119 Type 2 diabetes mellitus without complications: Secondary | ICD-10-CM | POA: Insufficient documentation

## 2024-02-02 DIAGNOSIS — N921 Excessive and frequent menstruation with irregular cycle: Secondary | ICD-10-CM | POA: Diagnosis not present

## 2024-02-02 DIAGNOSIS — I10 Essential (primary) hypertension: Secondary | ICD-10-CM | POA: Insufficient documentation

## 2024-02-02 LAB — CBC
HCT: 27.6 % — ABNORMAL LOW (ref 36.0–46.0)
Hemoglobin: 8.3 g/dL — ABNORMAL LOW (ref 12.0–15.0)
MCH: 26 pg (ref 26.0–34.0)
MCHC: 30.1 g/dL (ref 30.0–36.0)
MCV: 86.5 fL (ref 80.0–100.0)
Platelets: 400 10*3/uL (ref 150–400)
RBC: 3.19 MIL/uL — ABNORMAL LOW (ref 3.87–5.11)
RDW: 16.5 % — ABNORMAL HIGH (ref 11.5–15.5)
WBC: 11.8 10*3/uL — ABNORMAL HIGH (ref 4.0–10.5)
nRBC: 0.2 % (ref 0.0–0.2)

## 2024-02-02 LAB — COMPREHENSIVE METABOLIC PANEL WITH GFR
ALT: 15 U/L (ref 0–44)
AST: 36 U/L (ref 15–41)
Albumin: 4.1 g/dL (ref 3.5–5.0)
Alkaline Phosphatase: 37 U/L — ABNORMAL LOW (ref 38–126)
Anion gap: 11 (ref 5–15)
BUN: 11 mg/dL (ref 6–20)
CO2: 21 mmol/L — ABNORMAL LOW (ref 22–32)
Calcium: 8.8 mg/dL — ABNORMAL LOW (ref 8.9–10.3)
Chloride: 104 mmol/L (ref 98–111)
Creatinine, Ser: 0.89 mg/dL (ref 0.44–1.00)
GFR, Estimated: >60 mL/min (ref >60)
Glucose, Bld: 204 mg/dL — ABNORMAL HIGH (ref 70–99)
Potassium: 4.3 mmol/L (ref 3.5–5.1)
Sodium: 136 mmol/L (ref 135–145)
Total Bilirubin: 1 mg/dL (ref 0.0–1.2)
Total Protein: 7.4 g/dL (ref 6.5–8.1)

## 2024-02-02 LAB — PREPARE RBC (CROSSMATCH)

## 2024-02-02 LAB — ABO/RH: ABO/RH(D): A POS

## 2024-02-02 LAB — HCG, QUANTITATIVE, PREGNANCY: hCG, Beta Chain, Quant, S: 1 m[IU]/mL (ref <5)

## 2024-02-02 MED ORDER — MEDROXYPROGESTERONE ACETATE 10 MG PO TABS: 20.0000 mg | ORAL_TABLET | Freq: Every day | ORAL | 0 refills | Status: DC

## 2024-02-02 MED ORDER — FERROUS SULFATE 325 (65 FE) MG PO TBEC: 325.0000 mg | DELAYED_RELEASE_TABLET | Freq: Two times a day (BID) | ORAL | 0 refills | Status: DC

## 2024-02-02 MED ORDER — SODIUM CHLORIDE 0.9 % IV SOLN
10.0000 mL/h | Freq: Once | INTRAVENOUS | Status: AC
  Administered 2024-02-02: 10 mL/h via INTRAVENOUS

## 2024-02-02 MED ORDER — MEDROXYPROGESTERONE ACETATE 10 MG PO TABS
20.0000 mg | ORAL_TABLET | Freq: Once | ORAL | Status: AC
  Administered 2024-02-02: 20 mg via ORAL
  Filled 2024-02-02: qty 2

## 2024-02-02 NOTE — ED Triage Notes (Signed)
 Patient comes in via pov with complaints of severe menstrual bleeding. Pt states that she has been on her cyle since the end of April. Pt states that the flow got lighter yesterday and the day before, but then this morning it got very heavy again. Pt states that she is soaking thru a pad in less than an hour an reports passing clots as well. Pt is experiencing cramping 5/10, but was worse yesterday. Pt states that this happened back in January, and was seen for it, and it cleared up on it's own. Pt tried to make an appt with OB, but can't be seen until June. Pt is alert and oriented x4, with no signs of acute distress at this time.

## 2024-02-02 NOTE — ED Provider Notes (Signed)
 [facility] Provider Note    Event Date/Time   First MD Initiated Contact with Patient 02/02/24 1421     (approximate)   History   Vaginal Bleeding  Patient comes in via pov with complaints of severe menstrual bleeding. Pt states that she has been on her cyle since the end of April. Pt states that the flow got lighter yesterday and the day before, but then this morning it got very heavy again. Pt states that she is soaking thru a pad in less than an hour an reports passing clots as well. Pt is experiencing cramping 5/10, but was worse yesterday. Pt states that this happened back in January, and was seen for it, and it cleared up on it's own. Pt tried to make an appt with OB, but can't be seen until June. Pt is alert and oriented x4, with no signs of acute distress at this time.   HPI Laurie Cameron is a 38 y.o. female PMH hypertension, diabetes, hyperlipidemia, BMI >45 presents for evaluation of vaginal bleeding and lower abdominal cramping - Patient's last period started around May 20 and has been ongoing since then.  Usually about 2 pads daily.  Yesterday she had worse than usual lower abdominal cramping in the morning but largely resolved in the afternoon.  Bleeding almost entirely resolved as well.  In this afternoon she started developing worsened bleeding, about 1 pad every 1-2 hours.  Does have some mild-moderate lower abdominal cramping today. - No bleeding disorders - Non-smoker.  Does have a family history of strokes. - + Lightheadedness, fatigue.  No chest pain or shortness of breath. - States no possibility she could be pregnant    Per chart review, Last seen in family medicine clinic 11/09/2023 with complaint of menorrhagia.  Tentative plan for consideration of OCPs. Ultrasound ordered, subsequently performed 11/20/23, showed thickened endometrium and enlarged left ovary.         Physical Exam   Triage Vital Signs: ED Triage Vitals  Encounter  Vitals Group     BP 02/02/24 1414 135/75     Systolic BP Percentile --      Diastolic BP Percentile --      Pulse Rate 02/02/24 1414 (!) 127     Resp 02/02/24 1414 18     Temp 02/02/24 1414 98.2 F (36.8 C)     Temp src --      SpO2 02/02/24 1414 100 %     Weight 02/02/24 1415 (!) 315 lb (142.9 kg)     Height 02/02/24 1415 5\' 9"  (1.753 m)     Head Circumference --      Peak Flow --      Pain Score 02/02/24 1414 5     Pain Loc --      Pain Education --      Exclude from Growth Chart --     Most recent vital signs: Vitals:   02/02/24 1642 02/02/24 1701  BP: 101/70 103/70  Pulse: (!) 107 99  Resp: 20 20  Temp: 98.1 F (36.7 C) 97.9 F (36.6 C)  SpO2:  97%     General: Awake, no distress.  CV:  Good peripheral perfusion.  Mild tachycardia (~110 at time of my eval), regular rhythm, RP 2+ Resp:  Normal effort. CTAB Abd:  No distention. Nontender to deep palpation throughout. No CVAT.  Pelvic:  Offered, pt declined    ED Results / Procedures / Treatments   Labs (all labs ordered are  listed, but only abnormal results are displayed) Labs Reviewed  CBC - Abnormal; Notable for the following components:      Result Value   WBC 11.8 (*)    RBC 3.19 (*)    Hemoglobin 8.3 (*)    HCT 27.6 (*)    RDW 16.5 (*)    All other components within normal limits  COMPREHENSIVE METABOLIC PANEL WITH GFR - Abnormal; Notable for the following components:   CO2 21 (*)    Glucose, Bld 204 (*)    Calcium  8.8 (*)    Alkaline Phosphatase 37 (*)    All other components within normal limits  HCG, QUANTITATIVE, PREGNANCY  POC URINE PREG, ED  TYPE AND SCREEN  PREPARE RBC (CROSSMATCH)  ABO/RH     EKG  N/a   RADIOLOGY N/a    PROCEDURES:  Critical Care performed: Yes, see critical care procedure note(s)  .Critical Care  Performed by: Collis Deaner, MD Authorized by: Collis Deaner, MD   Critical care provider statement:    Critical care time (minutes):  30   Critical  care was necessary to treat or prevent imminent or life-threatening deterioration of the following conditions: Severe anemia with ongoing bleeding requiring blood transfusion.   Critical care was time spent personally by me on the following activities:  Development of treatment plan with patient or surrogate, discussions with consultants, evaluation of patient's response to treatment, examination of patient, ordering and review of laboratory studies, ordering and review of radiographic studies, ordering and performing treatments and interventions, pulse oximetry, re-evaluation of patient's condition and review of old charts    MEDICATIONS ORDERED IN ED: Medications  0.9 %  sodium chloride infusion (10 mL/hr Intravenous New Bag/Given 02/02/24 1646)  medroxyPROGESTERone (PROVERA) tablet 20 mg (20 mg Oral Given 02/02/24 1538)     IMPRESSION / MDM / ASSESSMENT AND PLAN / ED COURSE  I reviewed the triage vital signs and the nursing notes.                              DDX/MDM/AP: Differential diagnosis includes, but is not limited to, menorrhagia, considered but doubt ectopic pregnancy.  Consider underlying anemia.  Patient did have recent pelvic ultrasound showing thickened endometrium but no underlying fibroids about 3 months ago, no indication for repeat at this time.  Patient is amenable to starting OCPs or other medications though had previously declined in clinic.  Has OB/GYN follow-up currently scheduled on 03/04/2024.  Plan: - Labs - Transfuse as needed - Discuss with OB/GYN  Patient's presentation is most consistent with acute presentation with potential threat to life or bodily function.  The patient is on the cardiac monitor to evaluate for evidence of arrhythmia and/or significant heart rate changes.  ED course below.  Workup with anemia to 8.3, down from 10. Two months ago.  Given patient is symptomatic with fatigue, lightheadedness, and near syncope today has ongoing bleeding, did  decide in shared decision making to proceed with blood transfusion.  Transfuse single unit of blood in emergency department.  Heart rate normalized.  Discussed with OB/GYN and did recommend starting Provera 20 mg daily for the next 15 days and will work to expedite her outpatient follow-up.  Patient in agreement with plan, again declines pelvic exam though fortunately no massive bleeding appreciated here in emergency department.  Also started on iron supplementation.  Plan for outpatient follow-up.  ED return precautions in place.  Patient  agrees with plan.  Clinical Course as of 02/02/24 1915  Sat Feb 02, 2024  1440 CBC with hemoglobin of 8.3.  Most recently 10.1 two months ago. [MM]  1441 Discussed hemoglobin level with patient.  Confirms near syncope and lightheadedness with generalized fatigue which is new for her.  Discussed possible transfusion for symptomatic anemia in the setting of ongoing bleeding, patient would like to proceed.  Ordered 1 unit PRBCs.  Consented for blood transfusion. [MM]  1446 OB gyn ([facility]OB) paged to discuss [MM]  1455 D/w OBGYN, Angelita Kendall CNM - rec prosterone only medication, will call back shortly with recs [MM]  1506 Final OB recs: provera 20mg  daily x 15 days  They will try to arrange to see her sooner in clinic [MM]  1510 CMP reviewed, no significant abnormalities [MM]  1525 Hcg not c/w pregnancy [MM]  1612 Patient reevaluated, remains well-appearing.  Does endorse some ongoing bleeding.  Heart rate about 107 at time of my eval.  Blood available, will start transfusion shortly  Received first dose of provera [MM]    Clinical Course User Index [MM] Collis Deaner, MD     FINAL CLINICAL IMPRESSION(S) / ED DIAGNOSES   Final diagnoses:  Vaginal bleeding  Menorrhagia with irregular cycle  Anemia, unspecified type     Rx / DC Orders   ED Discharge Orders          Ordered    medroxyPROGESTERone (PROVERA) 10 MG tablet  Daily        02/02/24 1615     ferrous sulfate 325 (65 FE) MG EC tablet  2 times daily        02/02/24 1617             Note:  This document was prepared using Dragon voice recognition software and may include unintentional dictation errors.   Collis Deaner, MD 02/02/24 [phone]

## 2024-02-02 NOTE — Discharge Instructions (Addendum)
 Your evaluation in the emergency department was notable for a worsened anemia.  Believe this is due to your ongoing vaginal bleeding, and I discussed your case with an OB/GYN provider.  They did recommend starting you on an oral contraceptive pill to help with your bleeding--you received the first dose today, and you should continue this starting tomorrow.  I also prescribed you iron to help your body build new blood cells.  You received a transfusion of blood in the emergency department.  I asked your OB/GYN provider to see you sooner--please call them Monday afternoon if you have not yet heard from them.  Return to the emergency department with any new or worsening symptoms.

## 2024-02-02 NOTE — ED Notes (Signed)
Report given to Cat RN

## 2024-02-03 LAB — TYPE AND SCREEN
ABO/RH(D): A POS
Antibody Screen: NEGATIVE
Unit division: 0

## 2024-02-03 LAB — BPAM RBC
Blood Product Expiration Date: 202506082359
ISSUE DATE / TIME: 202505101623
Unit Type and Rh: 6200

## 2024-02-06 ENCOUNTER — Encounter: Payer: Managed Care, Other (non HMO) | Admitting: Family

## 2024-02-22 NOTE — Progress Notes (Unsigned)
    GYNECOLOGY PROGRESS NOTE  Subjective:  PCP: Calista Catching, FNP  Patient ID: Laurie Cameron, female    DOB: 1985/11/25, 38 y.o.   MRN: [id number]  HPI  Patient is a 38 y.o. G0P0000 female who presents for ED follow up from 02/02/24 for heavy vaginal bleeding.Pt states that she has been on her cyle since the end of April 20th. Pt states that she is soaking thru a pad in less than an hour an reports passing clots as well. Pt states that this happened back in January, and was seen for it, and it cleared up on it's own. It took about 2-3 weeks to stop. ED plan was to start Provera  10mg  daily and Iron 325mg  twice daily. She took provera  for 2 weeks and it didn't stop just got lighter. Starting to get heavy again  PAP -10/18/20 NILM/HPV Negative   Ultrasound 11/21/23-1. Thickened endometrium. 2. Enlarged left ovary. {Common ambulatory SmartLinks:19316}  Review of Systems {ros; complete:30496}   Objective:   Blood pressure (!) 144/74, pulse (!) 120, height 5\' 9"  (1.753 m), weight 298 lb (135.2 kg), last menstrual period 01/20/2024. Body mass index is 44.01 kg/m.  General appearance: {general exam:16600} Abdomen: {abdominal exam:16834} Pelvic: {pelvic exam:16852::"cervix normal in appearance","external genitalia normal","no adnexal masses or tenderness","no cervical motion tenderness","rectovaginal septum normal","uterus normal size, shape, and consistency","vagina normal without discharge"} Extremities: {extremity exam:5109} Neurologic: {neuro exam:17854}   Assessment/Plan:   1. Abnormal uterine bleeding (AUB)   2. Thickened endometrium   3. Cervical cancer screening      There are no diagnoses linked to this encounter.     Sofia Dunn, DO [facility]

## 2024-02-27 ENCOUNTER — Ambulatory Visit: Admitting: Obstetrics

## 2024-02-27 ENCOUNTER — Encounter: Payer: Self-pay | Admitting: Obstetrics

## 2024-02-27 VITALS — BP 144/74 | HR 120 | Ht 69.0 in | Wt 298.0 lb

## 2024-02-27 DIAGNOSIS — Z09 Encounter for follow-up examination after completed treatment for conditions other than malignant neoplasm: Secondary | ICD-10-CM

## 2024-02-27 DIAGNOSIS — N939 Abnormal uterine and vaginal bleeding, unspecified: Secondary | ICD-10-CM | POA: Diagnosis not present

## 2024-02-27 DIAGNOSIS — Z124 Encounter for screening for malignant neoplasm of cervix: Secondary | ICD-10-CM

## 2024-02-27 DIAGNOSIS — R9389 Abnormal findings on diagnostic imaging of other specified body structures: Secondary | ICD-10-CM | POA: Diagnosis not present

## 2024-02-27 DIAGNOSIS — C542 Malignant neoplasm of myometrium: Secondary | ICD-10-CM | POA: Diagnosis not present

## 2024-02-27 DIAGNOSIS — N888 Other specified noninflammatory disorders of cervix uteri: Secondary | ICD-10-CM

## 2024-02-27 MED ORDER — TRANEXAMIC ACID 650 MG PO TABS
1300.0000 mg | ORAL_TABLET | Freq: Three times a day (TID) | ORAL | 0 refills | Status: DC
Start: 2024-02-27 — End: 2024-03-05

## 2024-02-27 MED ORDER — MEDROXYPROGESTERONE ACETATE 10 MG PO TABS: 10.0000 mg | ORAL_TABLET | Freq: Two times a day (BID) | ORAL | 0 refills | Status: DC

## 2024-02-28 DIAGNOSIS — N888 Other specified noninflammatory disorders of cervix uteri: Secondary | ICD-10-CM | POA: Insufficient documentation

## 2024-02-28 DIAGNOSIS — N939 Abnormal uterine and vaginal bleeding, unspecified: Secondary | ICD-10-CM | POA: Insufficient documentation

## 2024-02-28 DIAGNOSIS — R9389 Abnormal findings on diagnostic imaging of other specified body structures: Secondary | ICD-10-CM | POA: Insufficient documentation

## 2024-03-04 ENCOUNTER — Other Ambulatory Visit: Payer: Self-pay | Admitting: Obstetrics

## 2024-03-04 ENCOUNTER — Encounter: Admitting: Obstetrics and Gynecology

## 2024-03-04 ENCOUNTER — Ambulatory Visit: Payer: Self-pay | Admitting: Obstetrics

## 2024-03-04 DIAGNOSIS — C55 Malignant neoplasm of uterus, part unspecified: Secondary | ICD-10-CM | POA: Insufficient documentation

## 2024-03-04 LAB — ANATOMIC PATHOLOGY REPORT

## 2024-03-05 ENCOUNTER — Inpatient Hospital Stay

## 2024-03-05 ENCOUNTER — Encounter: Payer: Self-pay | Admitting: Obstetrics and Gynecology

## 2024-03-05 ENCOUNTER — Inpatient Hospital Stay: Attending: Obstetrics and Gynecology | Admitting: Obstetrics and Gynecology

## 2024-03-05 ENCOUNTER — Encounter: Admitting: Family

## 2024-03-05 ENCOUNTER — Ambulatory Visit
Admission: RE | Admit: 2024-03-05 | Discharge: 2024-03-05 | Disposition: A | Discharge status: Home / Self Care | Source: Ambulatory Visit | Attending: Nurse Practitioner | Admitting: Nurse Practitioner

## 2024-03-05 VITALS — BP 130/64 | HR 109 | Resp 18 | Ht 69.0 in | Wt 294.0 lb

## 2024-03-05 DIAGNOSIS — Z803 Family history of malignant neoplasm of breast: Secondary | ICD-10-CM | POA: Diagnosis not present

## 2024-03-05 DIAGNOSIS — E119 Type 2 diabetes mellitus without complications: Secondary | ICD-10-CM | POA: Insufficient documentation

## 2024-03-05 DIAGNOSIS — C55 Malignant neoplasm of uterus, part unspecified: Secondary | ICD-10-CM | POA: Insufficient documentation

## 2024-03-05 DIAGNOSIS — N939 Abnormal uterine and vaginal bleeding, unspecified: Secondary | ICD-10-CM | POA: Diagnosis not present

## 2024-03-05 DIAGNOSIS — E785 Hyperlipidemia, unspecified: Secondary | ICD-10-CM | POA: Diagnosis not present

## 2024-03-05 DIAGNOSIS — E039 Hypothyroidism, unspecified: Secondary | ICD-10-CM | POA: Diagnosis not present

## 2024-03-05 DIAGNOSIS — Z809 Family history of malignant neoplasm, unspecified: Secondary | ICD-10-CM | POA: Insufficient documentation

## 2024-03-05 DIAGNOSIS — Z832 Family history of diseases of the blood and blood-forming organs and certain disorders involving the immune mechanism: Secondary | ICD-10-CM | POA: Insufficient documentation

## 2024-03-05 DIAGNOSIS — Z83438 Family history of other disorder of lipoprotein metabolism and other lipidemia: Secondary | ICD-10-CM | POA: Insufficient documentation

## 2024-03-05 DIAGNOSIS — Z833 Family history of diabetes mellitus: Secondary | ICD-10-CM | POA: Insufficient documentation

## 2024-03-05 DIAGNOSIS — Z8261 Family history of arthritis: Secondary | ICD-10-CM | POA: Diagnosis not present

## 2024-03-05 DIAGNOSIS — R918 Other nonspecific abnormal finding of lung field: Secondary | ICD-10-CM | POA: Diagnosis not present

## 2024-03-05 DIAGNOSIS — Z7189 Other specified counseling: Secondary | ICD-10-CM

## 2024-03-05 DIAGNOSIS — Z811 Family history of alcohol abuse and dependence: Secondary | ICD-10-CM | POA: Insufficient documentation

## 2024-03-05 DIAGNOSIS — I10 Essential (primary) hypertension: Secondary | ICD-10-CM | POA: Diagnosis not present

## 2024-03-05 DIAGNOSIS — Z8616 Personal history of COVID-19: Secondary | ICD-10-CM | POA: Diagnosis not present

## 2024-03-05 DIAGNOSIS — D500 Iron deficiency anemia secondary to blood loss (chronic): Secondary | ICD-10-CM | POA: Diagnosis not present

## 2024-03-05 DIAGNOSIS — R9389 Abnormal findings on diagnostic imaging of other specified body structures: Secondary | ICD-10-CM | POA: Diagnosis not present

## 2024-03-05 DIAGNOSIS — Z6841 Body Mass Index (BMI) 40.0 and over, adult: Secondary | ICD-10-CM | POA: Insufficient documentation

## 2024-03-05 DIAGNOSIS — Z8249 Family history of ischemic heart disease and other diseases of the circulatory system: Secondary | ICD-10-CM | POA: Diagnosis not present

## 2024-03-05 DIAGNOSIS — Z9049 Acquired absence of other specified parts of digestive tract: Secondary | ICD-10-CM | POA: Insufficient documentation

## 2024-03-05 DIAGNOSIS — Z818 Family history of other mental and behavioral disorders: Secondary | ICD-10-CM | POA: Diagnosis not present

## 2024-03-05 DIAGNOSIS — E669 Obesity, unspecified: Secondary | ICD-10-CM | POA: Diagnosis not present

## 2024-03-05 DIAGNOSIS — E079 Disorder of thyroid, unspecified: Secondary | ICD-10-CM | POA: Insufficient documentation

## 2024-03-05 DIAGNOSIS — Z79899 Other long term (current) drug therapy: Secondary | ICD-10-CM | POA: Diagnosis not present

## 2024-03-05 MED ORDER — IOHEXOL 350 MG/ML SOLN
100.0000 mL | Freq: Once | INTRAVENOUS | Status: AC | PRN
  Administered 2024-03-05: 100 mL via INTRAVENOUS

## 2024-03-05 NOTE — Progress Notes (Signed)
 2/10 pelvic pain - constant pressure.

## 2024-03-05 NOTE — Progress Notes (Signed)
 Gynecologic Oncology Consult Visit   Referring Provider: Sofia Dunn, DO   Chief Concern: Carcinosarcoma  Subjective:  Laurie Cameron is a 38 y.o. female who is seen in consultation from Dr. 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  She initially presented with two episodes of AUB/HMB (Jan/Apr-May '25) and with thickened endometrium on Feb '25 pelvic US  and L ovarian enlargement.   11/20/23 US  PELVIS FINDINGS: Uterus - anteverted, 10 x 7 x 6 cm. The endometrium homogeneously thickened. The uterine cavity is empty. There are no uterine masses. Right ovary: Unremarkable, 4.6 x 4.1 x 3.2 cm. Left ovary: Enlarged, 6.0 x 4.4 x 3.7 cm. Images of the adnexae demonstrated no masses or fluid collections. Color Doppler demonstrated ovarian blood flow. IMPRESSION: 1. Thickened endometrium. 2. Enlarged left ovary.  02/27/2024  Cervical polypectomy and EMBx performed.  MALIGNANT MIXED MESODERMAL TUMOR (CARCINOSARCOMA). SEE COMMENT.  Immunostain p16 is patchy while ER and PR are positive. Immunostain p53 shows the wild-type  (non-mutated) pattern.   Pap pending  Of not she has a h/o iron deficiency anemia and required a blood transfusion on 02/02/2024 for a hemoglobin of 8.3.  She presents to discuss management options.   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/04/2024   Abnormal uterine bleeding (AUB) 02/28/2024   Thickened endometrium 02/28/2024   Cervical mass 02/28/2024   Menorrhagia 11/09/2023   Ganglion cyst 10/07/2021   HLD (hyperlipidemia) 12/17/2020   COVID-19 10/15/2019   Vertigo 01/24/2019   BMI 45.0-49.9, adult (HCC) 10/18/2018   Diabetes mellitus without complication (HCC) 10/09/2018   Nodule of skin of right hand 09/13/2018   Folliculitis 09/13/2018   Family history of bleeding disorder 12/05/2017   Hypothyroidism 12/05/2017   Other fatigue 12/05/2017   HTN  (hypertension) 08/30/2016   Routine physical examination 08/30/2016    Past Medical History: Past Medical History:  Diagnosis Date   Asthma    AS A CHILD- NO INHALERS   Dizziness    happened when first diagnosed with hypertension   Elevated blood pressure reading 08/30/2016   Frequent headaches    GERD (gastroesophageal reflux disease)    OCC-TUMS PRN   Hypertension    RUQ pain 08/30/2016    Past Surgical History: Past Surgical History:  Procedure Laterality Date   CHOLECYSTECTOMY N/A 02/01/2017   Procedure: LAPAROSCOPIC CHOLECYSTECTOMY;  Surgeon: Gwyndolyn Lerner, MD;  Location: [facility] ORS;  Service: General;  Laterality: N/A;    Past Gynecologic History:  Menarche: age 49 Menstrual details: lasts 5-6 days Menses: regular History of OCP use- yes around age 24. Discontinued Denies history of abnormal paps or STIs  OB History:  OB History  Gravida Para Term Preterm AB Living  0 0 0 0 0 0  SAB IAB Ectopic Multiple Live Births  0 0 0 0 0   Family History: Family History  Problem Relation Age of Onset   Arthritis Mother    Hyperlipidemia Mother    Hypertension Mother    Breast cancer Mother 6       negative BRCA testing   Arthritis Father    Hyperlipidemia Father    Heart disease Father    Hypertension Father    GER disease Father    Arthritis Maternal Grandmother    Hyperlipidemia Maternal Grandmother    Heart disease Maternal Grandmother    Hypertension Maternal Grandmother    Diabetes Maternal Grandmother    Breast cancer  Maternal Grandmother 42   Arthritis Maternal Grandfather    Hyperlipidemia Maternal Grandfather    Alcohol abuse Paternal Grandmother    Arthritis Paternal Grandmother    Hyperlipidemia Paternal Grandmother    Hypertension Paternal Grandmother    Mental illness Paternal Grandmother    Alcohol abuse Paternal Grandfather    Arthritis Paternal Grandfather    Hyperlipidemia Paternal Grandfather    Hyperlipidemia Maternal Aunt     Hypertension Maternal Aunt    Diabetes Maternal Aunt    Breast cancer Maternal Aunt 40   Deep vein thrombosis Maternal Aunt    Cancer Maternal Uncle    Mental illness Paternal Aunt    Alcohol abuse Paternal Uncle    Cancer Paternal Uncle    Colon cancer Neg Hx    Thyroid cancer Neg Hx    Social History: Social History   Socioeconomic History   Marital status: Single    Spouse name: Not on file   Number of children: Not on file   Years of education: Not on file   Highest education level: Bachelor's degree (e.g., BA, AB, BS)  Occupational History   Not on file  Tobacco Use   Smoking status: Never   Smokeless tobacco: Never  Vaping Use   Vaping status: Never Used  Substance and Sexual Activity   Alcohol use: Yes    Comment: RARE   Drug use: No   Sexual activity: Not Currently  Other Topics Concern   Not on file  Social History Narrative   Works at [employer]   Enjoys working in her garden   Social Drivers of Corporate investment banker Strain: Low Risk  (11/08/2023)   Overall Financial Resource Strain (CARDIA)    Difficulty of Paying Living Expenses: Not very hard  Food Insecurity: No Food Insecurity (11/08/2023)   Hunger Vital Sign    Worried About Running Out of Food in the Last Year: Never true    Ran Out of Food in the Last Year: Never true  Transportation Needs: No Transportation Needs (11/08/2023)   PRAPARE - Administrator, Civil Service (Medical): No    Lack of Transportation (Non-Medical): No  Physical Activity: Insufficiently Active (11/08/2023)   Exercise Vital Sign    Days of Exercise per Week: 3 days    Minutes of Exercise per Session: 20 min  Stress: No Stress Concern Present (11/08/2023)   [employer] of Occupational Health - Occupational Stress Questionnaire    Feeling of Stress : Only a little  Social Connections: Moderately Isolated (11/08/2023)   Social Connection and Isolation Panel [NHANES]    Frequency of Communication with  Friends and Family: More than three times a week    Frequency of Social Gatherings with Friends and Family: More than three times a week    Attends Religious Services: 1 to 4 times per year    Active Member of [employer] or Organizations: No    Attends Engineer, structural: Not on file    Marital Status: Never married  Catering manager Violence: Not on file   Allergies: No Known Allergies  Current Medications: Current Outpatient Medications  Medication Sig Dispense Refill   amLODipine  (NORVASC ) 5 MG tablet Take 1 tablet (5 mg total) by mouth daily. 90 tablet 3   levothyroxine  (SYNTHROID ) 100 MCG tablet Take 100 mcg by mouth daily.     lisinopril  (ZESTRIL ) 20 MG tablet TAKE 1 TABLET BY MOUTH DAILY 90 tablet 3   medroxyPROGESTERone  (PROVERA ) 10 MG tablet  Take 1 tablet (10 mg total) by mouth in the morning and at bedtime for 14 days. 28 tablet 0   metFORMIN  (GLUCOPHAGE -XR) 500 MG 24 hr tablet Take 2 tablets (1,000 mg total) by mouth 2 (two) times daily. 360 tablet 3   Multiple Vitamins-Minerals (EQ MULTIVITAMINS ADULT GUMMY PO) Take 2 tablets by mouth daily.     rosuvastatin  (CRESTOR ) 10 MG tablet TAKE 1 TABLET(10 MG) BY MOUTH DAILY 90 tablet 3   RYBELSUS  7 MG TABS TAKE 1 TABLET BY MOUTH DAILY 90 tablet 3   calcium  carbonate (TUMS - DOSED IN MG ELEMENTAL CALCIUM ) 500 MG chewable tablet Chew 1 tablet by mouth as needed for indigestion or heartburn. (Patient not taking: Reported on 03/05/2024)     ferrous sulfate  325 (65 FE) MG EC tablet Take 1 tablet (325 mg total) by mouth 2 (two) times daily. 60 tablet 0   ibuprofen (ADVIL,MOTRIN) 200 MG tablet Take 400 mg by mouth every 6 (six) hours as needed for mild pain. (Patient not taking: Reported on 03/05/2024)     No current facility-administered medications for this visit.   Facility-Administered Medications Ordered in Other Visits  Medication Dose Route Frequency Provider Last Rate Last Admin   iohexol (OMNIPAQUE) 350 MG/ML injection 100 mL   100 mL Intravenous Once PRN Nelda Balsam, NP       Review of Systems General: negative for fevers, changes in weight or night sweats Skin: negative for changes in moles or sores or rash Eyes: negative for changes in vision HEENT: negative for change in hearing, tinnitus, voice changes Pulmonary: negative for dyspnea, orthopnea, productive cough, wheezing Cardiac: negative for palpitations, pain Gastrointestinal: negative for nausea, vomiting, constipation, diarrhea, hematemesis, hematochezia Genitourinary/Sexual: negative for dysuria, retention, hematuria, incontinence Ob/Gyn:  negative for abnormal bleeding, or pain Musculoskeletal: negative for pain, joint pain, back pain Hematology: negative for easy bruising, abnormal bleeding Neurologic/Psych: negative for headaches, seizures, paralysis, weakness, numbness   Objective:  Physical Examination:  BP 130/64 (BP Location: Left Arm, Patient Position: Sitting)   Pulse (!) 109   Resp 18   Ht 5' 9 (1.753 m)   Wt 294 lb (133.4 kg)   LMP 01/20/2024   SpO2 100%   BMI 43.42 kg/m    ECOG Performance Status: 1 - Symptomatic but completely ambulatory  GENERAL: Patient is a well appearing female in no acute distress. Obese.  HEENT:  Sclerae anicteric.  Oropharynx clear and moist. Neck is supple.  NODES:  No cervical, supraclavicular, or axillary lymphadenopathy palpated.  LUNGS:  Clear to auscultation bilaterally.  No wheezes or rhonchi. HEART:  Regular rate and rhythm. No murmur appreciated. ABDOMEN:  Soft, nontender, nondistended. No ascites. No organomegaly palpated however, exam limited d/t habitus.  EXTREMITIES:  No peripheral edema.   NEURO:  Nonfocal. Well oriented.  Appropriate affect.  Pelvic: Exam Chaperoned by RN EGBUS: no lesions Cervix: no lesions, nontender, mobile. The previously seen polyp has been removed. Vagina: no lesions, no discharge or bleeding. On palpation 8 mm nodule upper vaginal fornix - smooth to  palpation and nontender  Uterus: globular, nontender, mobile, deviated to right. Unable to determine size due to habitus. BME no palpable masses; parametria smooth   Lab Review  Lab Results  Component Value Date   WBC 11.8 (H) 02/02/2024   HGB 8.3 (L) 02/02/2024   HCT 27.6 (L) 02/02/2024   MCV 86.5 02/02/2024   PLT 400 02/02/2024     Chemistry      Component Value Date/Time  NA 136 02/02/2024 1418   NA 137 12/04/2023 1607   K 4.3 02/02/2024 1418   CL 104 02/02/2024 1418   CO2 21 (L) 02/02/2024 1418   BUN 11 02/02/2024 1418   BUN 16 12/04/2023 1607   CREATININE 0.89 02/02/2024 1418      Component Value Date/Time   CALCIUM  8.8 (L) 02/02/2024 1418   ALKPHOS 37 (L) 02/02/2024 1418   AST 36 02/02/2024 1418   ALT 15 02/02/2024 1418   BILITOT 1.0 02/02/2024 1418   BILITOT 0.6 11/20/2023 0929      Lab Results  Component Value Date   TSH 4.420 11/20/2023    Lab Results  Component Value Date   HGBA1C 6.8 (A) 11/09/2023    Lab Results  Component Value Date   IRON 42 11/20/2023   TIBC 433 11/20/2023   FERRITIN 24 11/20/2023   Iron Saturation 10 Low      Labs ordered today.   Radiologic Imaging: CT C/A/P ordered.     Assessment:  Laurie Cameron is a 38 y.o. female diagnosed with carcinosarcoma of the uterus.   Anemia, secondary to vaginal bleeding and iron deficiency status post transfusion  Body mass index is 43.42 kg/m.  Medical co-morbidities complicating care: thyroid disease, HTN, diabetes, obesity, and prior abdominal surgery.  Plan:   Problem List Items Addressed This Visit       Genitourinary   Carcinosarcoma of uterus [facility]) - Primary   Relevant Orders   CT CHEST ABDOMEN PELVIS W CONTRAST   CT CHEST ABDOMEN PELVIS W CONTRAST   Other Visit Diagnoses       Iron deficiency anemia due to chronic blood loss         Counseling and coordination of care          We discussed options for management and recommended CT scan to assess for evidence  of metastatic disease. If negative scan plan for surgery with TLH/BSO and SLN with Dr. Marella Shams. She is tentatively scheduled for March 18, 2024. We will order CBC, CMP, HbAIC, TSH, and iron studies to be done today. She will have them done at [facility].   We discussed that after surgery we will most likely recommend additional adjuvant therapy including chemotherapy and/or radiation.  Suggested return to clinic in  4 weeks after surgery.    The patient's diagnosis, an outline of the further diagnostic and laboratory studies which will be required, the recommendation, and alternatives were discussed.  All questions were answered to the patient's satisfaction.  A total of 80 minutes were spent with the patient/family today; >50% was spent in education, counseling and coordination of care for endometrial cancer.    Kenney Peacemaker, DNP, AGNP-C, AOCNP [facility] [phone] (clinic)  I personally had a face to face interaction and evaluated the patient jointly with the NP, Ms. Kenney Peacemaker.  I have reviewed her history and available records and have performed the key portions of the physical exam including lymph node survey, abdominal exam, pelvic exam with my findings confirming those documented above by the APP.  I have discussed the case with the APP and the patient.  I agree with the above documentation, assessment and plan which was fully formulated by me.  Counseling was completed by me.   I personally saw the patient and performed a substantive portion of this encounter in conjunction with the listed APP as documented above.  Kemya Shed Iola Manila, MD   CC:   Sofia Dunn, DO

## 2024-03-06 ENCOUNTER — Other Ambulatory Visit: Payer: Self-pay | Admitting: Nurse Practitioner

## 2024-03-06 ENCOUNTER — Encounter: Payer: Self-pay | Admitting: Nurse Practitioner

## 2024-03-06 DIAGNOSIS — D500 Iron deficiency anemia secondary to blood loss (chronic): Secondary | ICD-10-CM

## 2024-03-06 LAB — IGP, APTIMA HPV: HPV Aptima: NEGATIVE

## 2024-03-06 NOTE — Progress Notes (Signed)
 Ref to hematology for IV iron and anemia due to blood loss secondary to her newly diagnosed carcinosarcoma of the uterus. She has surgery planned on 04/17/24 at [facility].

## 2024-03-07 ENCOUNTER — Inpatient Hospital Stay

## 2024-03-07 ENCOUNTER — Encounter: Payer: Self-pay | Admitting: Oncology

## 2024-03-07 ENCOUNTER — Inpatient Hospital Stay: Admitting: Oncology

## 2024-03-07 VITALS — HR 98 | Temp 99.7°F | Resp 19 | Ht 69.0 in | Wt 297.3 lb

## 2024-03-07 VITALS — BP 126/75 | HR 91 | Temp 98.5°F | Resp 18

## 2024-03-07 DIAGNOSIS — D500 Iron deficiency anemia secondary to blood loss (chronic): Secondary | ICD-10-CM

## 2024-03-07 DIAGNOSIS — N939 Abnormal uterine and vaginal bleeding, unspecified: Secondary | ICD-10-CM

## 2024-03-07 DIAGNOSIS — R9389 Abnormal findings on diagnostic imaging of other specified body structures: Secondary | ICD-10-CM

## 2024-03-07 DIAGNOSIS — C55 Malignant neoplasm of uterus, part unspecified: Secondary | ICD-10-CM | POA: Diagnosis not present

## 2024-03-07 DIAGNOSIS — D509 Iron deficiency anemia, unspecified: Secondary | ICD-10-CM | POA: Insufficient documentation

## 2024-03-07 MED ORDER — SODIUM CHLORIDE 0.9% FLUSH
10.0000 mL | Freq: Once | INTRAVENOUS | Status: AC | PRN
  Administered 2024-03-07: 10 mL
  Filled 2024-03-07: qty 10

## 2024-03-07 MED ORDER — IRON SUCROSE 20 MG/ML IV SOLN: 200.0000 mg | INTRAVENOUS | Status: DC

## 2024-03-07 NOTE — Patient Instructions (Signed)
 Laurie Cameron

## 2024-03-07 NOTE — Progress Notes (Unsigned)
 Hematology/Oncology Consult note [facility] [phone] [phone]  Patient Care Team: Calista Catching, FNP as PCP - General (Family Medicine) Rochell Chroman, RN as Oncology Nurse Navigator   Name of the patient: Laurie Cameron  [id number]  08-23-1986    Reason for referral-iron deficient anemia   Referring physician-Margaret Eber Goldsmith, FNP  Date of visit: 03/07/24   History of presenting illness- Patient will also patient is a 38 year old female with a past medical history significant for hypertension hyperlipidemia hypothyroidism referred for iron deficiency anemia.  Lab work from 03/05/2024 showed white count of 8.8, H&H of 10/34.6 with an MCV of 79 and a platelet count of 509.  Ferritin levels were low at 20 with an iron saturation of 5% and elevated TIBC.  TSH normal at 2.6.  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  She has met with GYN oncology and will be undergoing TAH/BSO in 2 weeks time.  She has therefore been referred for presently to optimize her anemia given the presence of  ECOG PS- 0  Pain scale- 0   Review of systems- Review of Systems  Constitutional:  Negative for chills, fever, malaise/fatigue and weight loss.  HENT:  Negative for congestion, ear discharge and nosebleeds.   Eyes:  Negative for blurred vision.  Respiratory:  Negative for cough, hemoptysis, sputum production, shortness of breath and wheezing.   Cardiovascular:  Negative for chest pain, palpitations, orthopnea and claudication.  Gastrointestinal:  Negative for abdominal pain, blood in stool, constipation, diarrhea, heartburn, melena, nausea and vomiting.  Genitourinary:  Negative for dysuria, flank pain, frequency, hematuria and urgency.       Vaginal bleeding  Musculoskeletal:  Negative for back pain, joint pain and myalgias.  Skin:   Negative for rash.  Neurological:  Negative for dizziness, tingling, focal weakness, seizures, weakness and headaches.  Endo/Heme/Allergies:  Does not bruise/bleed easily.  Psychiatric/Behavioral:  Negative for depression and suicidal ideas. The patient does not have insomnia.     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   Abnormal uterine bleeding (AUB) 02/28/2024   Thickened endometrium 02/28/2024   Cervical mass 02/28/2024   Menorrhagia 11/09/2023   Ganglion cyst 10/07/2021   HLD (hyperlipidemia) 12/17/2020   COVID-19 10/15/2019   Vertigo 01/24/2019   BMI 45.0-49.9, adult (HCC) 10/18/2018   Diabetes mellitus without complication (HCC) 10/09/2018   Nodule of skin of right hand 09/13/2018   Folliculitis 09/13/2018   Family history of bleeding disorder 12/05/2017   Hypothyroidism 12/05/2017   Other fatigue 12/05/2017   HTN (hypertension) 08/30/2016   Routine physical examination 08/30/2016     Past Medical History:  Diagnosis Date   Asthma    AS A CHILD- NO INHALERS   Dizziness    happened when first diagnosed with hypertension   Elevated blood pressure reading 08/30/2016   Frequent headaches    GERD (gastroesophageal reflux disease)    OCC-TUMS PRN   Hypertension    RUQ pain 08/30/2016     Past Surgical History:  Procedure Laterality Date   CHOLECYSTECTOMY N/A 02/01/2017   Procedure: LAPAROSCOPIC CHOLECYSTECTOMY;  Surgeon: Gwyndolyn Lerner, MD;  Location: [facility] ORS;  Service: General;  Laterality: N/A;    Social History   Socioeconomic History   Marital status: Single    Spouse name: Not on file   Number of children:  Not on file   Years of education: Not on file   Highest education level: Bachelor's degree (e.g., BA, AB, BS)  Occupational History   Not on file  Tobacco Use   Smoking status: Never   Smokeless tobacco: Never  Vaping Use   Vaping status: Never Used  Substance and  Sexual Activity   Alcohol use: Yes    Comment: RARE   Drug use: No   Sexual activity: Not Currently  Other Topics Concern   Not on file  Social History Narrative   Works at [employer]   Enjoys working in her garden   Social Drivers of Corporate investment banker Strain: Low Risk  (11/08/2023)   Overall Financial Resource Strain (CARDIA)    Difficulty of Paying Living Expenses: Not very hard  Food Insecurity: No Food Insecurity (11/08/2023)   Hunger Vital Sign    Worried About Running Out of Food in the Last Year: Never true    Ran Out of Food in the Last Year: Never true  Transportation Needs: No Transportation Needs (11/08/2023)   PRAPARE - Administrator, Civil Service (Medical): No    Lack of Transportation (Non-Medical): No  Physical Activity: Insufficiently Active (11/08/2023)   Exercise Vital Sign    Days of Exercise per Week: 3 days    Minutes of Exercise per Session: 20 min  Stress: No Stress Concern Present (11/08/2023)   [employer] of Occupational Health - Occupational Stress Questionnaire    Feeling of Stress : Only a little  Social Connections: Moderately Isolated (11/08/2023)   Social Connection and Isolation Panel    Frequency of Communication with Friends and Family: More than three times a week    Frequency of Social Gatherings with Friends and Family: More than three times a week    Attends Religious Services: 1 to 4 times per year    Active Member of [employer] or Organizations: No    Attends Engineer, structural: Not on file    Marital Status: Never married  Catering manager Violence: Not on file     Family History  Problem Relation Age of Onset   Arthritis Mother    Hyperlipidemia Mother    Hypertension Mother    Breast cancer Mother 20       negative BRCA testing   Arthritis Father    Hyperlipidemia Father    Heart disease Father    Hypertension Father    GER disease Father    Hyperlipidemia Maternal Aunt    Hypertension  Maternal Aunt    Diabetes Maternal Aunt    Breast cancer Maternal Aunt 40   Deep vein thrombosis Maternal Aunt    Cancer Maternal Uncle    Mental illness Paternal Aunt    Alcohol abuse Paternal Uncle    Cancer Paternal Uncle    Arthritis Maternal Grandmother    Hyperlipidemia Maternal Grandmother    Heart disease Maternal Grandmother    Hypertension Maternal Grandmother    Diabetes Maternal Grandmother    Breast cancer Maternal Grandmother 55   Arthritis Maternal Grandfather    Hyperlipidemia Maternal Grandfather    Alcohol abuse Paternal Grandmother    Arthritis Paternal Grandmother    Hyperlipidemia Paternal Grandmother    Hypertension Paternal Grandmother    Mental illness Paternal Grandmother    Alcohol abuse Paternal Grandfather    Arthritis Paternal Grandfather    Hyperlipidemia Paternal Grandfather    Colon cancer Neg Hx    Thyroid cancer Neg Hx  Current Outpatient Medications:    amLODipine  (NORVASC ) 5 MG tablet, Take 1 tablet (5 mg total) by mouth daily., Disp: 90 tablet, Rfl: 3   ferrous sulfate  325 (65 FE) MG EC tablet, Take 1 tablet (325 mg total) by mouth 2 (two) times daily., Disp: 60 tablet, Rfl: 0   levothyroxine  (SYNTHROID ) 100 MCG tablet, Take 100 mcg by mouth daily., Disp: , Rfl:    lisinopril  (ZESTRIL ) 20 MG tablet, TAKE 1 TABLET BY MOUTH DAILY, Disp: 90 tablet, Rfl: 3   medroxyPROGESTERone  (PROVERA ) 10 MG tablet, Take 1 tablet (10 mg total) by mouth in the morning and at bedtime for 14 days., Disp: 28 tablet, Rfl: 0   metFORMIN  (GLUCOPHAGE -XR) 500 MG 24 hr tablet, Take 2 tablets (1,000 mg total) by mouth 2 (two) times daily., Disp: 360 tablet, Rfl: 3   Multiple Vitamins-Minerals (EQ MULTIVITAMINS ADULT GUMMY PO), Take 2 tablets by mouth daily., Disp: , Rfl:    rosuvastatin  (CRESTOR ) 10 MG tablet, TAKE 1 TABLET(10 MG) BY MOUTH DAILY, Disp: 90 tablet, Rfl: 3   RYBELSUS  7 MG TABS, TAKE 1 TABLET BY MOUTH DAILY, Disp: 90 tablet, Rfl: 3   calcium  carbonate  (TUMS - DOSED IN MG ELEMENTAL CALCIUM ) 500 MG chewable tablet, Chew 1 tablet by mouth as needed for indigestion or heartburn. (Patient not taking: Reported on 03/07/2024), Disp: , Rfl:    ibuprofen (ADVIL,MOTRIN) 200 MG tablet, Take 400 mg by mouth every 6 (six) hours as needed for mild pain. (Patient not taking: Reported on 03/07/2024), Disp: , Rfl:    Physical exam:  Vitals:   03/07/24 1334  Pulse: 98  Resp: 19  Temp: 99.7 F (37.6 C)  TempSrc: Tympanic  SpO2: 100%  Weight: 297 lb 4.8 oz (134.9 kg)  Height: 5' 9 (1.753 m)   Physical Exam  Cardiovascular:     Rate and Rhythm: Normal rate and regular rhythm.     Heart sounds: Normal heart sounds.  Pulmonary:     Effort: Pulmonary effort is normal.     Breath sounds: Normal breath sounds.  Abdominal:     General: Bowel sounds are normal.     Palpations: Abdomen is soft.   Skin:    General: Skin is warm and dry.   Neurological:     Mental Status: She is alert and oriented to person, place, and time.           Latest Ref Rng & Units 02/02/2024    2:18 PM  CMP  Glucose 70 - 99 mg/dL 161   BUN 6 - 20 mg/dL 11   Creatinine 0.96 - 1.00 mg/dL 0.45   Sodium 409 - 811 mmol/L 136   Potassium 3.5 - 5.1 mmol/L 4.3   Chloride 98 - 111 mmol/L 104   CO2 22 - 32 mmol/L 21   Calcium  8.9 - 10.3 mg/dL 8.8   Total Protein 6.5 - 8.1 g/dL 7.4   Total Bilirubin 0.0 - 1.2 mg/dL 1.0   Alkaline Phos 38 - 126 U/L 37   AST 15 - 41 U/L 36   ALT 0 - 44 U/L 15       Latest Ref Rng & Units 02/02/2024    2:18 PM  CBC  WBC 4.0 - 10.5 K/uL 11.8   Hemoglobin 12.0 - 15.0 g/dL 8.3   Hematocrit 91.4 - 46.0 % 27.6   Platelets 150 - 400 K/uL 400     No images are attached to the encounter.  CT CHEST ABDOMEN PELVIS W  CONTRAST Result Date: 03/05/2024 CLINICAL DATA:  High-grade endometrial carcinosarcoma. Staging. * Tracking Code: BO * EXAM: CT CHEST, ABDOMEN, AND PELVIS WITH CONTRAST TECHNIQUE: Multidetector CT imaging of the chest, abdomen and  pelvis was performed following the standard protocol during bolus administration of intravenous contrast. RADIATION DOSE REDUCTION: This exam was performed according to the departmental dose-optimization program which includes automated exposure control, adjustment of the mA and/or kV according to patient size and/or use of iterative reconstruction technique. CONTRAST:  OMNIPAQUE  IOHEXOL  350 MG/ML SOLN COMPARISON:  Abdominal ultrasound 2017 December. Pelvic ultrasound 11/20/2023. FINDINGS: CT CHEST FINDINGS Cardiovascular: Heart is nonenlarged. No pericardial effusion. The thoracic aorta is normal course and caliber. Slight pulsation artifact along the ascending aorta. Mediastinum/Nodes: The thoracic esophagus is normal course and caliber. Preserved thyroid gland. No specific abnormal lymph node enlargement identified in the chest including axillary regions, hilum or mediastinum. No supraclavicular nodes. Lungs/Pleura: No consolidation, pneumothorax or effusion. There are 2 calcified nodule left lower lobe series 3, image 107 and 110 consistent with old granulomatous disease. Musculoskeletal: Mild degenerative changes identified along the spine. CT ABDOMEN PELVIS FINDINGS Hepatobiliary: No focal liver abnormality is seen. Status post cholecystectomy. No biliary dilatation. Patent portal vein. Pancreas: Unremarkable. No pancreatic ductal dilatation or surrounding inflammatory changes. Spleen: Normal in size without focal abnormality. Adrenals/Urinary Tract: Minimal nodular thickening along both adrenal glands on image 61, nonspecific. No enhancing renal mass or collecting system dilatation. The ureters have normal course and caliber extending down to the urinary bladder. Preserved contour to the urinary bladder. Stomach/Bowel: On this non oral contrast exam, large bowel has a normal course and caliber. Scattered colonic stool. Normal appendix extends inferior to the cecum in the right lower quadrant. Moderate  debris in the stomach. Small bowel has a normal course and caliber. Vascular/Lymphatic: No specific abnormal lymph node enlargement identified in the abdomen and pelvis. Normal caliber aorta and IVC. Reproductive: Uterus is present. There is some thickening of the endometrium. Please correlate for known history of endometrial carcinoma. No separate adnexal mass or parametrial lesion. No significant free fluid in the pelvis. Other: No free intra-abdominal air. Musculoskeletal: Scattered degenerative changes along the spine and pelvis. Posterior osteophytes seen at the L1-2 level with some central canal encroachment. IMPRESSION: Thickening of the endometrium of the uterus. Please correlate with history of known neoplasm. No separate free fluid, adnexal mass or nodal enlargement. Small calcified lung nodules consistent with old granulomatous disease. Electronically Signed   By: Adrianna Horde M.D.   On: 03/05/2024 13:29    Assessment and plan- Patient is a 38 y.o. female referred for iron deficiency anemia secondary to newly diagnosed carcinosarcoma of the uterus  Given that patient has TAH/BSO surgery coming up in 2 weeks and her labs are suggestive of iron deficiency we will proceed with IV iron at this time.  Her H&H 5 days ago was 8.3/27.6 with labs suggestive of iron deficiency.  I will plan to give her 200 mg of IV Venofer today and 2 more doses of Venofer 300 mg IV.  Discussed risks and benefits of Venofer including all but not limited to possible risk of infusion anaphylactic reaction.  Patient understands and agrees to proceed as planned.  We will likely end up giving her 1 more dose postsurgery.  With regards to carcinosarcoma of the uterus it is very likely the patient is going to need adjuvant chemotherapy and radiation which I will coordinate with GYN oncology after surgery   Thank you for this  kind referral and the opportunity to participate in the care of this patient   Visit Diagnosis 1.  Iron deficiency anemia due to chronic blood loss     Dr. Seretha Dance, MD, MPH [facility] [phone] 03/07/2024

## 2024-03-09 ENCOUNTER — Encounter: Payer: Self-pay | Admitting: Oncology

## 2024-03-11 ENCOUNTER — Inpatient Hospital Stay

## 2024-03-11 VITALS — BP 105/72 | HR 98 | Temp 98.7°F | Resp 18

## 2024-03-11 DIAGNOSIS — D500 Iron deficiency anemia secondary to blood loss (chronic): Secondary | ICD-10-CM

## 2024-03-11 DIAGNOSIS — C55 Malignant neoplasm of uterus, part unspecified: Secondary | ICD-10-CM | POA: Diagnosis not present

## 2024-03-11 MED ORDER — SODIUM CHLORIDE 0.9 % IV SOLN
300.0000 mg | INTRAVENOUS | Status: DC
  Administered 2024-03-11: 300 mg via INTRAVENOUS
  Filled 2024-03-11: qty 300

## 2024-03-13 ENCOUNTER — Inpatient Hospital Stay

## 2024-03-13 VITALS — BP 103/58 | HR 91 | Temp 99.6°F | Resp 18

## 2024-03-13 DIAGNOSIS — D500 Iron deficiency anemia secondary to blood loss (chronic): Secondary | ICD-10-CM

## 2024-03-13 DIAGNOSIS — C55 Malignant neoplasm of uterus, part unspecified: Secondary | ICD-10-CM | POA: Diagnosis not present

## 2024-03-13 MED ORDER — SODIUM CHLORIDE 0.9 % IV SOLN
INTRAVENOUS | Status: DC
  Filled 2024-03-13: qty 250

## 2024-03-13 MED ORDER — SODIUM CHLORIDE 0.9 % IV SOLN
300.0000 mg | INTRAVENOUS | Status: DC
  Administered 2024-03-13: 300 mg via INTRAVENOUS
  Filled 2024-03-13: qty 300

## 2024-03-19 ENCOUNTER — Ambulatory Visit: Admitting: Obstetrics

## 2024-03-20 ENCOUNTER — Telehealth: Payer: Self-pay

## 2024-03-20 NOTE — Telephone Encounter (Signed)
 Documentation for SDOH Screenings.

## 2024-03-21 ENCOUNTER — Encounter: Admitting: Family

## 2024-03-22 ENCOUNTER — Other Ambulatory Visit: Payer: Self-pay | Admitting: Family

## 2024-03-24 NOTE — Telephone Encounter (Signed)
 Detailed vm left asking pt to CB to schedule an appt mycharrt msg sent as well

## 2024-03-25 ENCOUNTER — Telehealth: Payer: Self-pay | Admitting: *Deleted

## 2024-03-25 NOTE — Telephone Encounter (Signed)
 [employer] sent short-term disability update form. Form completed today and Tinnie Dawn, NP signed form on behalf of Dr. Mancil form faxed to [phone]

## 2024-03-26 ENCOUNTER — Ambulatory Visit

## 2024-04-02 ENCOUNTER — Other Ambulatory Visit: Payer: Self-pay | Admitting: Nurse Practitioner

## 2024-04-02 ENCOUNTER — Inpatient Hospital Stay: Attending: Obstetrics and Gynecology | Admitting: Obstetrics and Gynecology

## 2024-04-02 ENCOUNTER — Inpatient Hospital Stay

## 2024-04-02 VITALS — BP 123/70 | HR 94 | Temp 97.8°F | Resp 19 | Wt 295.5 lb

## 2024-04-02 DIAGNOSIS — E039 Hypothyroidism, unspecified: Secondary | ICD-10-CM | POA: Insufficient documentation

## 2024-04-02 DIAGNOSIS — Z809 Family history of malignant neoplasm, unspecified: Secondary | ICD-10-CM | POA: Insufficient documentation

## 2024-04-02 DIAGNOSIS — D500 Iron deficiency anemia secondary to blood loss (chronic): Secondary | ICD-10-CM

## 2024-04-02 DIAGNOSIS — Z9049 Acquired absence of other specified parts of digestive tract: Secondary | ICD-10-CM | POA: Insufficient documentation

## 2024-04-02 DIAGNOSIS — C541 Malignant neoplasm of endometrium: Secondary | ICD-10-CM | POA: Diagnosis present

## 2024-04-02 DIAGNOSIS — E119 Type 2 diabetes mellitus without complications: Secondary | ICD-10-CM | POA: Insufficient documentation

## 2024-04-02 DIAGNOSIS — C55 Malignant neoplasm of uterus, part unspecified: Secondary | ICD-10-CM

## 2024-04-02 DIAGNOSIS — Z811 Family history of alcohol abuse and dependence: Secondary | ICD-10-CM | POA: Insufficient documentation

## 2024-04-02 DIAGNOSIS — I10 Essential (primary) hypertension: Secondary | ICD-10-CM | POA: Diagnosis not present

## 2024-04-02 DIAGNOSIS — R7401 Elevation of levels of liver transaminase levels: Secondary | ICD-10-CM | POA: Diagnosis not present

## 2024-04-02 DIAGNOSIS — Z79899 Other long term (current) drug therapy: Secondary | ICD-10-CM | POA: Insufficient documentation

## 2024-04-02 DIAGNOSIS — J45909 Unspecified asthma, uncomplicated: Secondary | ICD-10-CM | POA: Diagnosis not present

## 2024-04-02 DIAGNOSIS — Z818 Family history of other mental and behavioral disorders: Secondary | ICD-10-CM | POA: Insufficient documentation

## 2024-04-02 DIAGNOSIS — E785 Hyperlipidemia, unspecified: Secondary | ICD-10-CM | POA: Diagnosis not present

## 2024-04-02 DIAGNOSIS — Z8616 Personal history of COVID-19: Secondary | ICD-10-CM | POA: Insufficient documentation

## 2024-04-02 DIAGNOSIS — Z8249 Family history of ischemic heart disease and other diseases of the circulatory system: Secondary | ICD-10-CM | POA: Insufficient documentation

## 2024-04-02 DIAGNOSIS — E876 Hypokalemia: Secondary | ICD-10-CM | POA: Diagnosis not present

## 2024-04-02 DIAGNOSIS — Z8261 Family history of arthritis: Secondary | ICD-10-CM | POA: Diagnosis not present

## 2024-04-02 DIAGNOSIS — Z833 Family history of diabetes mellitus: Secondary | ICD-10-CM | POA: Insufficient documentation

## 2024-04-02 DIAGNOSIS — Z90710 Acquired absence of both cervix and uterus: Secondary | ICD-10-CM | POA: Diagnosis not present

## 2024-04-02 DIAGNOSIS — D509 Iron deficiency anemia, unspecified: Secondary | ICD-10-CM | POA: Diagnosis not present

## 2024-04-02 DIAGNOSIS — R9389 Abnormal findings on diagnostic imaging of other specified body structures: Secondary | ICD-10-CM | POA: Insufficient documentation

## 2024-04-02 DIAGNOSIS — D649 Anemia, unspecified: Secondary | ICD-10-CM

## 2024-04-02 DIAGNOSIS — Z90722 Acquired absence of ovaries, bilateral: Secondary | ICD-10-CM | POA: Diagnosis not present

## 2024-04-02 DIAGNOSIS — Z8379 Family history of other diseases of the digestive system: Secondary | ICD-10-CM | POA: Insufficient documentation

## 2024-04-02 DIAGNOSIS — E871 Hypo-osmolality and hyponatremia: Secondary | ICD-10-CM | POA: Insufficient documentation

## 2024-04-02 DIAGNOSIS — Z793 Long term (current) use of hormonal contraceptives: Secondary | ICD-10-CM | POA: Insufficient documentation

## 2024-04-02 DIAGNOSIS — Z803 Family history of malignant neoplasm of breast: Secondary | ICD-10-CM | POA: Insufficient documentation

## 2024-04-02 DIAGNOSIS — Z8349 Family history of other endocrine, nutritional and metabolic diseases: Secondary | ICD-10-CM | POA: Diagnosis not present

## 2024-04-02 DIAGNOSIS — N8003 Adenomyosis of the uterus: Secondary | ICD-10-CM | POA: Insufficient documentation

## 2024-04-02 DIAGNOSIS — Z83438 Family history of other disorder of lipoprotein metabolism and other lipidemia: Secondary | ICD-10-CM | POA: Diagnosis not present

## 2024-04-02 LAB — GENETIC SCREENING ORDER

## 2024-04-02 NOTE — Progress Notes (Signed)
 Gynecologic Oncology Interval Visit   Referring Provider: Estil Mangle, DO   Chief Concern: Carcinosarcoma  Subjective:  Laurie Cameron is a 38 y.o. female, initially seen in consultation from Dr. 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  03/14/2024-TLH BSO with mapping and biopsies with Dr. Mancil at [facility]  Surgical Pathology:  A.  Sentinel lymph node, Left, external iliac, biopsy: - One lymph node, negative for malignancy (0/1).     B.  Sentinel lymph node, right, external iliac, biopsy: - Two lymph nodes, negative for malignancy (0/2).   C.  Uterus and bilateral ovaries and fallopian tubes, hysterectomy and bilateral salpingo-oophorectomy: - Endometrioid adenocarcinoma of the endometrium, FIGO grade 1 of 3, corded and hyalinized (CHEC) type.  - Myometrial invasion: absent.  - Lymphatic/vascular invasion: absent.  - Cervical stromal invasion: absent. - Ovaries and fallopian tubes, bilateral: Negative for malignancy.  Additional findings: Endometrial intraepithelial neoplasia (EIN)  Adenomyosis   Pelvic Washings: Negative. No evidence of malignancy  TUMOR    Tumor Size:    Greatest gross dimension (Centimeters): 4.5 cm     Histologic Type:    Endometrioid carcinoma     Histologic Grade:    FIGO grade 1     Adenomyosis:    Present, uninvolved by carcinoma     Uterine Serosal Involvement:    Not identified     Lower Uterine Segment Involvement:    Not identified     Cervical Involvement:    Not identified     Other Tissue / Organ Involvement:    Not identified (other tissues / organs submitted and not involved)     Peritoneal / Pelvic Washings / Ascitic Fluid:    Negative for malignant cells     Lymphatic and / or Vascular Invasion:    Not identified   REGIONAL LYMPH NODES     Regional Lymph Node Status:          :    All  regional lymph nodes negative for tumor cells         Lymph Nodes Examined:              Total Number of Pelvic Nodes Examined:    3           Number of Pelvic Sentinel Nodes Examined:    3           Total Number of Para-aortic Nodes Examined:    0   pTNM CLASSIFICATION (AJCC 8th Edition)     Reporting of pT, pN, and (when applicable) pM categories is based on information available to the pathologist at the time the report is issued. As per the AJCC (Chapter 1, 8th Ed.) it is the managing physician's responsibility to establish the final pathologic stage based upon all pertinent information, including but potentially not limited to this pathology report.     pT Category:    pT1a     pN Category:    pN0     N Suffix:    (sn)   FIGO STAGE     FIGO Stage (FIGO 2009 Staging / 2018 FIGO Cancer Report):    IA      % of Cells Staining Intensity Score Interpretation  ER IHC 42 2+ POSITIVE  PR IHC 51 3+ POSITIVE   P53 normal/wild-type   MMR/MSI are pending.  Case discussed at Tumor Board, however, awaiting review of pathology.   She has a CHEC subtype, without myometrial invasion, LVSI, or cervical stromal invasion. Recommendations: Observation. Recommend germline testing given age <50 years. Offer endometrial cancer consortium study.   Preoperatively she with low iron  saturation and received 3 doses of IV Venofer .  Postoperatively her hemoglobin was 9.1   Gynecologic oncology History: She initially presented as a 38 year old female with two episodes of AUB/HMB (Jan/Apr-May '25) and with thickened endometrium on Feb '25 pelvic US  and L ovarian enlargement.   11/20/23 US  PELVIS FINDINGS: Uterus - anteverted, 10 x 7 x 6 cm. The endometrium homogeneously thickened. The uterine cavity is empty. There are no uterine masses. Right ovary: Unremarkable, 4.6 x 4.1 x 3.2 cm. Left ovary: Enlarged, 6.0 x 4.4 x 3.7 cm. Images of the adnexae demonstrated no masses or fluid collections. Color Doppler  demonstrated ovarian blood flow. IMPRESSION: 1. Thickened endometrium. 2. Enlarged left ovary.  02/27/2024  Cervical polypectomy and EMBx performed.  MALIGNANT MIXED MESODERMAL TUMOR (CARCINOSARCOMA). SEE COMMENT.  Immunostain p16 is patchy while ER and PR are positive. Immunostain p53 shows the wild-type (non-mutated) pattern.   02/27/24- Pap-epithelial cell abnormality, atypical squamous cells, cannot exclude high-grade squamous intraepithelial lesion (ASC-H).  HPV negative  Of not she has a h/o iron  deficiency anemia and required a blood transfusion on 02/02/2024 for a hemoglobin of 8.3.  03/05/2024 CT chest abdomen pelvis with contrast Thickening of the endometrium measurement provided.  Consistent with patient's known history of endometrial carcinoma.  No adnexal masses are.  Trillat lesions.  No significant free fluid in the pelvis.  No lymphadenopathy.  No presence of metastatic disease. Small calcified lung nodules consistent with old granulomatous disease      Problem List: Patient Active Problem List   Diagnosis Date Noted   Iron  deficiency anemia 03/07/2024   Carcinosarcoma of uterus (HCC) 03/04/2024   Abnormal uterine bleeding (AUB) 02/28/2024   Thickened endometrium 02/28/2024   Cervical mass 02/28/2024   Menorrhagia 11/09/2023   Ganglion cyst 10/07/2021   HLD (hyperlipidemia) 12/17/2020   COVID-19 10/15/2019   Vertigo 01/24/2019   BMI 45.0-49.9, adult (HCC) 10/18/2018   Diabetes mellitus without complication (HCC) 10/09/2018   Nodule of skin of right hand 09/13/2018   Folliculitis 09/13/2018   Family history of bleeding disorder 12/05/2017   Hypothyroidism 12/05/2017   Other fatigue 12/05/2017   HTN (hypertension) 08/30/2016   Routine physical examination 08/30/2016    Past Medical History: Past Medical History:  Diagnosis Date   Asthma    AS A CHILD- NO INHALERS   Dizziness    happened when first diagnosed with hypertension   Elevated blood pressure  reading 08/30/2016   Frequent headaches    GERD (gastroesophageal reflux disease)    OCC-TUMS PRN   Hypertension    RUQ pain 08/30/2016    Past Surgical History: Past Surgical History:  Procedure Laterality Date   CHOLECYSTECTOMY N/A 02/01/2017   Procedure: LAPAROSCOPIC CHOLECYSTECTOMY;  Surgeon: Shelva Dunnings, MD;  Location: [facility] ORS;  Service: General;  Laterality: N/A;    Past Gynecologic History:  Menarche: age 66 Menstrual details: lasts 5-6 days Menses: regular History of OCP use- yes around age 38. Discontinued Denies history of abnormal paps or STIs  OB History:  OB History  Gravida Para Term Preterm AB Living  0 0 0 0 0 0  SAB IAB Ectopic Multiple Live Births  0 0 0 0 0   Family History: Family History  Problem  Relation Age of Onset   Arthritis Mother    Hyperlipidemia Mother    Hypertension Mother    Breast cancer Mother 32       negative BRCA testing   Arthritis Father    Hyperlipidemia Father    Heart disease Father    Hypertension Father    GER disease Father    Hyperlipidemia Maternal Aunt    Hypertension Maternal Aunt    Diabetes Maternal Aunt    Breast cancer Maternal Aunt 40   Deep vein thrombosis Maternal Aunt    Cancer Maternal Uncle    Mental illness Paternal Aunt    Alcohol abuse Paternal Uncle    Cancer Paternal Uncle    Arthritis Maternal Grandmother    Hyperlipidemia Maternal Grandmother    Heart disease Maternal Grandmother    Hypertension Maternal Grandmother    Diabetes Maternal Grandmother    Breast cancer Maternal Grandmother 48   Arthritis Maternal Grandfather    Hyperlipidemia Maternal Grandfather    Alcohol abuse Paternal Grandmother    Arthritis Paternal Grandmother    Hyperlipidemia Paternal Grandmother    Hypertension Paternal Grandmother    Mental illness Paternal Grandmother    Alcohol abuse Paternal Grandfather    Arthritis Paternal Grandfather    Hyperlipidemia Paternal Grandfather    Colon cancer Neg Hx     Thyroid cancer Neg Hx    Social History: Social History   Socioeconomic History   Marital status: Single    Spouse name: Not on file   Number of children: Not on file   Years of education: Not on file   Highest education level: Bachelor's degree (e.g., BA, AB, BS)  Occupational History   Not on file  Tobacco Use   Smoking status: Never   Smokeless tobacco: Never  Vaping Use   Vaping status: Never Used  Substance and Sexual Activity   Alcohol use: Yes    Comment: RARE   Drug use: No   Sexual activity: Not Currently  Other Topics Concern   Not on file  Social History Narrative   Works at [employer]   Enjoys working in her garden   Social Drivers of Corporate investment banker Strain: Low Risk  (11/08/2023)   Overall Financial Resource Strain (CARDIA)    Difficulty of Paying Living Expenses: Not very hard  Food Insecurity: No Food Insecurity (03/20/2024)   Hunger Vital Sign    Worried About Running Out of Food in the Last Year: Never true    Ran Out of Food in the Last Year: Never true  Transportation Needs: No Transportation Needs (03/20/2024)   PRAPARE - Administrator, Civil Service (Medical): No    Lack of Transportation (Non-Medical): No  Physical Activity: Insufficiently Active (11/08/2023)   Exercise Vital Sign    Days of Exercise per Week: 3 days    Minutes of Exercise per Session: 20 min  Stress: No Stress Concern Present (11/08/2023)   [employer] of Occupational Health - Occupational Stress Questionnaire    Feeling of Stress : Only a little  Social Connections: Moderately Isolated (11/08/2023)   Social Connection and Isolation Panel    Frequency of Communication with Friends and Family: More than three times a week    Frequency of Social Gatherings with Friends and Family: More than three times a week    Attends Religious Services: 1 to 4 times per year    Active Member of [employer] or Organizations: No    Attends [employer]  or Organization Meetings: Not  on file    Marital Status: Never married  Intimate Partner Violence: Not At Risk (03/20/2024)   Humiliation, Afraid, Rape, and Kick questionnaire    Fear of Current or Ex-Partner: No    Emotionally Abused: No    Physically Abused: No    Sexually Abused: No   Allergies: No Known Allergies  Current Medications: Current Outpatient Medications  Medication Sig Dispense Refill   amLODipine  (NORVASC ) 5 MG tablet Take 1 tablet (5 mg total) by mouth daily. 90 tablet 3   ferrous sulfate  325 (65 FE) MG EC tablet Take 1 tablet (325 mg total) by mouth 2 (two) times daily. 60 tablet 0   levothyroxine  (SYNTHROID ) 100 MCG tablet Take 100 mcg by mouth daily.     lisinopril  (ZESTRIL ) 20 MG tablet TAKE 1 TABLET BY MOUTH DAILY 90 tablet 3   metFORMIN  (GLUCOPHAGE -XR) 500 MG 24 hr tablet Take 2 tablets (1,000 mg total) by mouth 2 (two) times daily. 360 tablet 3   Multiple Vitamins-Minerals (EQ MULTIVITAMINS ADULT GUMMY PO) Take 2 tablets by mouth daily.     rosuvastatin  (CRESTOR ) 10 MG tablet TAKE 1 TABLET(10 MG) BY MOUTH DAILY 90 tablet 3   Semaglutide  (RYBELSUS ) 7 MG TABS TAKE 1 TABLET BY MOUTH DAILY 90 tablet 0   calcium  carbonate (TUMS - DOSED IN MG ELEMENTAL CALCIUM ) 500 MG chewable tablet Chew 1 tablet by mouth as needed for indigestion or heartburn. (Patient not taking: Reported on 04/02/2024)     ibuprofen (ADVIL,MOTRIN) 200 MG tablet Take 400 mg by mouth every 6 (six) hours as needed for mild pain. (Patient not taking: Reported on 04/02/2024)     medroxyPROGESTERone  (PROVERA ) 10 MG tablet Take 1 tablet (10 mg total) by mouth in the morning and at bedtime for 14 days. 28 tablet 0   No current facility-administered medications for this visit.   Review of Systems General:  no complaints Skin: no complaints Eyes: no complaints HEENT: no complaints Breasts: no complaints Pulmonary: no complaints Cardiac: no complaints Gastrointestinal: abdominal pain Genitourinary/Sexual: no complaints Ob/Gyn: no  complaints Musculoskeletal: no complaints Hematology: no complaints Neurologic/Psych: no complaints  Objective:  Physical Examination:  BP 123/70   Pulse 94   Temp 97.8 F (36.6 C)   Resp 19   Wt 295 lb 8 oz (134 kg)   LMP 01/20/2024 (Exact Date)   SpO2 98%   BMI 43.64 kg/m    ECOG Performance Status: 1 - Symptomatic but completely ambulatory  GENERAL: Patient is a well appearing female in no acute distress. Obese.  HEENT:  Sclerae anicteric.  Oropharynx clear and moist. Neck is supple.  LUNGS: Normal respiratory rate ABDOMEN:  Soft, nontender, nondistended. No ascites. No organomegaly palpated however, exam limited d/t habitus.  SKIN: Incisions all healing well except the lower abdominal incision which has a mild separation and erythema. No active evidence of infection.  EXTREMITIES:  No peripheral edema.   NEURO:  Nonfocal. Well oriented.  Appropriate affect.  Pelvic: Exam Chaperoned by CMA EGBUS: no lesions Cervix: Surgically absent  Vagina: no lesions, or bleeding.  There is a mild amount of discharge that is normal given postoperative state.  The vaginal cuff is healed and there is a suture that still visible.  On palpation the vaginal cuff is nodular and slightly indurated consistent with recent postoperative state but there is no concerning masses. Uterus: Surgically absent BME: no palpable masses   Lab Review Component Ref Range & Units (hover) 2 wk ago  Sodium 134 Low    Potassium 3.3 Low    Chloride 104   Carbon Dioxide (CO2) 20 Low    Urea Nitrogen (BUN) 9   Creatinine 0.8   Glucose 109  Comment: Interpretive Data: Above is the NONFASTING reference range.  Below are the FASTING reference ranges: NORMAL:      70-99 mg/dL PREDIABETES: 899-874 mg/dL DIABETES:    > 874 mg/dL   Calcium  9.2   AST (Aspartate Aminotransferase) 45 High    ALT (Alanine Aminotransferase) 18   Bilirubin, Total 1.2   Alk Phos (Alkaline Phosphatase) 37   Albumin 4.1    Protein, Total 7.4   Anion Gap 10   BUN/CREA Ratio 11   Glomerular Filtration Rate (eGFR) 96   Component Ref Range & Units (hover) 2 wk ago    WBC (White Blood Cell Count) 9.4   Hemoglobin 9.1 Low    Hematocrit 30.6 Low    Platelets 394   MCV (Mean Corpuscular Volume) 82   MCH (Mean Corpuscular Hemoglobin) 24.3 Low    MCHC (Mean Corpuscular Hemoglobin Concentration) 29.7 Low    RBC (Red Blood Cell Count) 3.74 Low    RDW-CV (Red Cell Distribution Width) 19.0 High    NRBC (Nucleated Red Blood Cell Count) 0.00   NRBC % (Nucleated Red Blood Cell %) 0.0   MPV (Mean Platelet Volume) 8.8     Radiologic Imaging: CT CHEST ABDOMEN PELVIS W CONTRAST CLINICAL DATA:  High-grade endometrial carcinosarcoma. Staging. * Tracking Code: BO *  EXAM: CT CHEST, ABDOMEN, AND PELVIS WITH CONTRAST  TECHNIQUE: Multidetector CT imaging of the chest, abdomen and pelvis was performed following the standard protocol during bolus administration of intravenous contrast.  RADIATION DOSE REDUCTION: This exam was performed according to the departmental dose-optimization program which includes automated exposure control, adjustment of the mA and/or kV according to patient size and/or use of iterative reconstruction technique.  CONTRAST:  OMNIPAQUE  IOHEXOL  350 MG/ML SOLN  COMPARISON:  Abdominal ultrasound 2017 December. Pelvic ultrasound 11/20/2023.  FINDINGS: CT CHEST FINDINGS  Cardiovascular: Heart is nonenlarged. No pericardial effusion. The thoracic aorta is normal course and caliber. Slight pulsation artifact along the ascending aorta.  Mediastinum/Nodes: The thoracic esophagus is normal course and caliber. Preserved thyroid gland. No specific abnormal lymph node enlargement identified in the chest including axillary regions, hilum or mediastinum. No supraclavicular nodes.  Lungs/Pleura: No consolidation, pneumothorax or effusion. There are 2 calcified nodule left lower lobe series 3,  image 107 and 110 consistent with old granulomatous disease.  Musculoskeletal: Mild degenerative changes identified along the spine.  CT ABDOMEN PELVIS FINDINGS  Hepatobiliary: No focal liver abnormality is seen. Status post cholecystectomy. No biliary dilatation. Patent portal vein.  Pancreas: Unremarkable. No pancreatic ductal dilatation or surrounding inflammatory changes.  Spleen: Normal in size without focal abnormality.  Adrenals/Urinary Tract: Minimal nodular thickening along both adrenal glands on image 61, nonspecific. No enhancing renal mass or collecting system dilatation. The ureters have normal course and caliber extending down to the urinary bladder. Preserved contour to the urinary bladder.  Stomach/Bowel: On this non oral contrast exam, large bowel has a normal course and caliber. Scattered colonic stool. Normal appendix extends inferior to the cecum in the right lower quadrant. Moderate debris in the stomach. Small bowel has a normal course and caliber.  Vascular/Lymphatic: No specific abnormal lymph node enlargement identified in the abdomen and pelvis. Normal caliber aorta and IVC.  Reproductive: Uterus is present. There is some thickening of the endometrium. Please  correlate for known history of endometrial carcinoma. No separate adnexal mass or parametrial lesion. No significant free fluid in the pelvis.  Other: No free intra-abdominal air.  Musculoskeletal: Scattered degenerative changes along the spine and pelvis. Posterior osteophytes seen at the L1-2 level with some central canal encroachment.  IMPRESSION: Thickening of the endometrium of the uterus. Please correlate with history of known neoplasm. No separate free fluid, adnexal mass or nodal enlargement.  Small calcified lung nodules consistent with old granulomatous disease.  Electronically Signed   By: Ranell Bring M.D.   On: 03/05/2024 13:29     Assessment:  Laurie Cameron is a 38  y.o. 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 CHEC subtype, without myometrial invasion, LVSI, or cervical stromal invasion.   Anemia, secondary to vaginal bleeding and iron  deficiency status post transfusion s/p iron  infusion.   Hyponatremia, hypokalemia, asymptomatic  Elevated AST  Medical co-morbidities complicating care: thyroid disease, HTN, diabetes, obesity, and prior abdominal surgery.  Plan:   Problem List Items Addressed This Visit       Genitourinary   Carcinosarcoma of uterus [facility]) - Primary   Relevant Orders   Genetic Screening Order (Completed)     Other   Iron  deficiency anemia   Other Visit Diagnoses       Hypokalemia         Elevated AST (SGOT)          Preliminary Tumor Board Recommendations: Observation. Recommend germline testing given age <50 years. Offer endometrial cancer consortium study  Given the discrepancy between preop and postop pathology we sent a message to the [facility] team to review the assess at tumor board and evaluate both the preop specimen as well as the pathology specimen.   Will obtain repeat labs to check on anemia, hyponatremia and hypokalemia.  She does not appear to have any symptoms which is reassuring.  She is also had a history of elevated AST in the past which is resolved.  We have ordered genetic testing.   Follow-up pending recommendations of final [facility] Tumor Board review.  The patient's diagnosis, an outline of the further diagnostic and laboratory studies which will be required, the recommendation, and alternatives were discussed.  All questions were answered to the patient's satisfaction.  Tinnie Dawn, DNP, AGNP-C, AOCNP [facility] [phone] (clinic)  I personally had a face to face interaction and evaluated the patient jointly with the NP, Ms. Tinnie Dawn.  I have reviewed her history and available records and have performed the key  portions of the physical exam including  lymph node survey, abdominal exam, pelvic exam with my findings confirming those documented above by the APP.  I have discussed the case with the APP and the patient.  I agree with the above documentation, assessment and plan which was fully formulated by me.  Counseling was completed by me.   I personally saw the patient and performed a substantive portion of this encounter in conjunction with the listed APP as documented above.  Meerab Maselli Isidor Constable, MD    CC:   Estil Mangle, DO

## 2024-04-02 NOTE — Progress Notes (Signed)
 Provided patient with [facility] requisition sheet for labs

## 2024-04-04 ENCOUNTER — Encounter: Payer: Self-pay | Admitting: *Deleted

## 2024-04-07 ENCOUNTER — Telehealth: Payer: Self-pay | Admitting: Obstetrics

## 2024-04-07 ENCOUNTER — Encounter: Payer: Self-pay | Admitting: Nurse Practitioner

## 2024-04-07 NOTE — Telephone Encounter (Signed)
 Social telephone call to pt, checking in after her surgery with GYN-ONC. She is 3wks post-op, doing well physically, reports her pathology hasn't come back yet so anxious to know results. Offered support and to contact us  if she needs anything.

## 2024-04-10 ENCOUNTER — Ambulatory Visit: Admitting: Family

## 2024-04-10 VITALS — BP 124/76 | HR 80 | Temp 97.9°F | Resp 20 | Ht 69.0 in | Wt 293.5 lb

## 2024-04-10 DIAGNOSIS — Z23 Encounter for immunization: Secondary | ICD-10-CM

## 2024-04-10 DIAGNOSIS — Z7984 Long term (current) use of oral hypoglycemic drugs: Secondary | ICD-10-CM

## 2024-04-10 DIAGNOSIS — I10 Essential (primary) hypertension: Secondary | ICD-10-CM | POA: Diagnosis not present

## 2024-04-10 DIAGNOSIS — C55 Malignant neoplasm of uterus, part unspecified: Secondary | ICD-10-CM | POA: Diagnosis not present

## 2024-04-10 DIAGNOSIS — E119 Type 2 diabetes mellitus without complications: Secondary | ICD-10-CM | POA: Diagnosis not present

## 2024-04-10 DIAGNOSIS — R7309 Other abnormal glucose: Secondary | ICD-10-CM

## 2024-04-10 LAB — POCT GLYCOSYLATED HEMOGLOBIN (HGB A1C): Hemoglobin A1C: 6.3 % — AB (ref 4.0–5.6)

## 2024-04-10 NOTE — Progress Notes (Signed)
 Assessment & Plan:  Diabetes mellitus without complication [facility]) Assessment & Plan: Lab Results  Component Value Date   HGBA1C 6.3 (A) 04/10/2024   Excellent control.  Continue metformin  1000 mg twice daily, Rybelsus  7 mg daily. Consider dc Rybelsus  in setting of weight loss; she will monitor and let me know.   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 She is in great spirits. Will follow closely along.    Elevated glucose -     POCT glycosylated hemoglobin (Hb A1C)  Encounter for administration of vaccine -     Measles/Mumps/Rubella Immunity -     Measles/Mumps/Rubella Immunity  Hypertension, unspecified type Assessment & Plan: Blood pressure well-controlled.  Continue lisinopril  to 20 mg every day in the setting of microalbuminemia.  Continue amlodipine  5 mg every day. Discussed decrease of lisinopril  to 10mg  every day if she continued to loose weight and/or had concerns of blood pressure becoming low.       Return precautions given.   Risks, benefits, and alternatives of the medications and treatment plan prescribed today were discussed, and patient expressed understanding.   Education regarding symptom management and diagnosis given to patient on AVS either electronically or printed.  Return in about 6 months (around 10/11/2024).  Laurie Northern, FNP  Subjective:    Patient ID: Laurie Cameron, female    DOB: 18-Jan-1986, 38 y.o.   MRN: [id number]  CC: Laurie Cameron is a 38 y.o. female who presents today for follow up.   HPI: Overall feels well Recovering from total hysterectomy.  She returns to work next week.  She is awaiting final pathology from [facility].  She would like MMR rechecked    Seen by Dr melanee prior to TAH/BSO for IDA; IV iron  given with follow up after surgery.  Follow up with oncology tomorrow for infusion Dx by Dr. 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 Current Outpatient Medications on File Prior to Visit  Medication Sig Dispense Refill   amLODipine  (NORVASC ) 5 MG tablet Take 1 tablet (5 mg total) by mouth daily. 90 tablet 3   levothyroxine  (SYNTHROID ) 100 MCG tablet Take 100 mcg by mouth daily.     lisinopril  (ZESTRIL ) 20 MG tablet TAKE 1 TABLET BY MOUTH DAILY 90 tablet 3   metFORMIN  (GLUCOPHAGE -XR) 500 MG 24 hr tablet Take 2 tablets (1,000 mg total) by mouth 2 (two) times daily. 360 tablet 3   Multiple Vitamins-Minerals (EQ MULTIVITAMINS ADULT GUMMY PO) Take 2 tablets by mouth daily.     rosuvastatin  (CRESTOR ) 10 MG tablet TAKE 1 TABLET(10 MG) BY MOUTH DAILY 90 tablet 3   Semaglutide  (RYBELSUS ) 7 MG TABS TAKE 1 TABLET BY MOUTH DAILY 90 tablet 0   No current facility-administered medications on file prior to visit.    Review of Systems  Constitutional:  Negative for chills and fever.  Respiratory:  Negative for cough.   Cardiovascular:  Negative for chest pain and palpitations.  Gastrointestinal:  Negative for nausea and vomiting.      Objective:    BP 124/76   Pulse 80   Temp 97.9 F (36.6 C)   Resp 20   Ht 5' 9 (1.753 m)   Wt 293 lb 8 oz (133.1 kg)  SpO2 95%   BMI 43.34 kg/m  BP Readings from Last 3 Encounters:  04/10/24 124/76  04/02/24 123/70  03/13/24 (!) 103/58   Wt Readings from Last 3 Encounters:  04/10/24 293 lb 8 oz (133.1 kg)  04/02/24 295 lb 8 oz (134 kg)  03/07/24 297 lb 4.8 oz (134.9 kg)    Physical Exam Vitals reviewed.  Constitutional:      Appearance: She is well-developed.  Eyes:     Conjunctiva/sclera: Conjunctivae normal.  Cardiovascular:     Rate and Rhythm: Normal rate and regular rhythm.     Pulses: Normal pulses.     Heart sounds: Normal heart sounds.  Pulmonary:     Effort: Pulmonary effort is normal.     Breath  sounds: Normal breath sounds. No wheezing, rhonchi or rales.  Skin:    General: Skin is warm and dry.  Neurological:     Mental Status: She is alert.  Psychiatric:        Speech: Speech normal.        Behavior: Behavior normal.        Thought Content: Thought content normal.

## 2024-04-10 NOTE — Patient Instructions (Signed)
 Nice to see you.   I am rooting for you and know you are going to beat this. Please let me know if you need anything at all.

## 2024-04-10 NOTE — Assessment & Plan Note (Signed)
 Recovering well post operatively. She is in great spirits. Will follow closely along.

## 2024-04-10 NOTE — Assessment & Plan Note (Signed)
 Lab Results  Component Value Date   HGBA1C 6.3 (A) 04/10/2024   Excellent control.  Continue metformin  1000 mg twice daily, Rybelsus  7 mg daily. Consider dc Rybelsus  in setting of weight loss; she will monitor and let me know.

## 2024-04-10 NOTE — Assessment & Plan Note (Signed)
 Blood pressure well-controlled.  Continue lisinopril  to 20 mg every day in the setting of microalbuminemia.  Continue amlodipine  5 mg every day. Discussed decrease of lisinopril  to 10mg  every day if she continued to loose weight and/or had concerns of blood pressure becoming low.

## 2024-04-11 ENCOUNTER — Ambulatory Visit

## 2024-04-11 VITALS — BP 123/86 | HR 98 | Temp 98.3°F | Resp 18

## 2024-04-11 DIAGNOSIS — C541 Malignant neoplasm of endometrium: Secondary | ICD-10-CM | POA: Diagnosis not present

## 2024-04-11 DIAGNOSIS — D500 Iron deficiency anemia secondary to blood loss (chronic): Secondary | ICD-10-CM

## 2024-04-11 MED ORDER — SODIUM CHLORIDE 0.9 % IV SOLN
INTRAVENOUS | Status: DC
  Filled 2024-04-11: qty 250

## 2024-04-11 MED ORDER — SODIUM CHLORIDE 0.9 % IV SOLN
300.0000 mg | INTRAVENOUS | Status: DC
  Administered 2024-04-11: 300 mg via INTRAVENOUS
  Filled 2024-04-11: qty 300

## 2024-04-11 NOTE — Patient Instructions (Signed)

## 2024-04-14 ENCOUNTER — Telehealth: Payer: Self-pay

## 2024-04-14 ENCOUNTER — Other Ambulatory Visit: Payer: Self-pay | Admitting: Family

## 2024-04-14 DIAGNOSIS — I10 Essential (primary) hypertension: Secondary | ICD-10-CM

## 2024-04-14 DIAGNOSIS — R030 Elevated blood-pressure reading, without diagnosis of hypertension: Secondary | ICD-10-CM

## 2024-04-14 NOTE — Telephone Encounter (Signed)
 Here is the documentation from our tumor board. The recommendations did not change.  [facility] Multidisciplinary Tumor Board   Patient's case was presented at multidisciplinary Tumor Board this week.   Case reviewed with records available at time of discussion.  This discussion will be shared with the primary care team however the final care plan is at the medical discretion of the treating providers.     Participants present: Dr Mancil, Dr Elby, Dr Caye, Dr Kristene, Dr Eloy, Dr Lurline (Radiation Oncology), Dr Alfredia, Dr Donella, Luke Oar, [place]  Claudene, Paulita Blanch, Paige Gorst , Harlene Hoar (Radiation Oncology), Dr Caswell (clinical pharmacist), genetic counselor, students, and clinical research team     Reviewed: pathology report and operative report   Literature review: NCCN Guidelines   Laurie Cameron is a 38 y.o. G0P0 with PMH of T2DM, BMI 43.4, HTN, hypothyroidism who presented for AUB/HMB and was found to have homogeneously thickened endometrium without uterine masses on outside pelvic US  (11/20/23). 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 CT CAP (03/05/24) without evidence of metastatic disease. Underwent dx laparoscopy, TLH, BSO, SLND, pelvic washings on 03/18/24. 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 Pelvic sentinel nodes, bilateral fallopian tubes/ovaries and washings negative for malignancy. In summary, 38 y.o. 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   Molecular/genetic testing: P53 WT, ER 2+ (42%), PR 3+ (51%), MMR/MSI pending   Recommendations: Observation. Recommend germline testing given age  <50 years. Offer endometrial cancer consortium study

## 2024-04-16 ENCOUNTER — Inpatient Hospital Stay: Admitting: Obstetrics and Gynecology

## 2024-04-16 VITALS — BP 132/72 | HR 108 | Temp 98.6°F | Resp 18 | Wt 294.3 lb

## 2024-04-16 DIAGNOSIS — Z9079 Acquired absence of other genital organ(s): Secondary | ICD-10-CM

## 2024-04-16 DIAGNOSIS — C55 Malignant neoplasm of uterus, part unspecified: Secondary | ICD-10-CM

## 2024-04-16 DIAGNOSIS — Z90710 Acquired absence of both cervix and uterus: Secondary | ICD-10-CM

## 2024-04-16 DIAGNOSIS — Z90722 Acquired absence of ovaries, bilateral: Secondary | ICD-10-CM

## 2024-04-16 NOTE — Progress Notes (Signed)
 Gynecologic Oncology Interval Visit   Referring Provider: Estil Mangle, DO   Chief Concern: Carcinosarcoma  Subjective:  Laurie Cameron is a 38 y.o. female, initially seen in consultation from Dr. 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  Here for further discussion of CHEC subtype.  We reviewed the slides at [facility] Tumor Board and that the [facility] subtype is associated with low grade behavior. Adjuvant therapy was not recommended.    She has some mild vasomotor symptoms, but not not interfering with sleep.  Gynecologic oncology History: She initially presented as a 38 year old female with two episodes of AUB/HMB (Jan/Apr-May '25) and with thickened endometrium on Feb '25 pelvic US  and L ovarian enlargement.   11/20/23 US  PELVIS FINDINGS: Uterus - anteverted, 10 x 7 x 6 cm. The endometrium homogeneously thickened. The uterine cavity is empty. There are no uterine masses. Right ovary: Unremarkable, 4.6 x 4.1 x 3.2 cm. Left ovary: Enlarged, 6.0 x 4.4 x 3.7 cm. Images of the adnexae demonstrated no masses or fluid collections. Color Doppler demonstrated ovarian blood flow. IMPRESSION: 1. Thickened endometrium. 2. Enlarged left ovary.  02/27/2024  Cervical polypectomy and EMBx performed.  MALIGNANT MIXED MESODERMAL TUMOR (CARCINOSARCOMA). SEE COMMENT.  Immunostain p16 is patchy while ER and PR are positive. Immunostain p53 shows the wild-type (non-mutated) pattern.   02/27/24- Pap-epithelial cell abnormality, atypical squamous cells, cannot exclude high-grade squamous intraepithelial lesion (ASC-H).  HPV negative  Of not she has a h/o iron  deficiency anemia and required a blood transfusion on 02/02/2024 for a hemoglobin of 8.3.  03/05/2024 CT chest abdomen pelvis with contrast Thickening of the endometrium measurement provided.  Consistent with  patient's known history of endometrial carcinoma.  No adnexal masses are.  Trillat lesions.  No significant free fluid in the pelvis.  No lymphadenopathy.  No presence of metastatic disease. Small calcified lung nodules consistent with old granulomatous disease  03/14/2024-TLH BSO with mapping and biopsies with Dr. Mancil at [facility]  Surgical Pathology:  A.  Sentinel lymph node, Left, external iliac, biopsy: - One lymph node, negative for malignancy (0/1).     B.  Sentinel lymph node, right, external iliac, biopsy: - Two lymph nodes, negative for malignancy (0/2).   C.  Uterus and bilateral ovaries and fallopian tubes, hysterectomy and bilateral salpingo-oophorectomy: - Endometrioid adenocarcinoma of the endometrium, FIGO grade 1 of 3, corded and hyalinized (CHEC) type.  - Myometrial invasion: absent.  - Lymphatic/vascular invasion: absent.  - Cervical stromal invasion: absent. - Ovaries and fallopian tubes, bilateral: Negative for malignancy.  Additional findings: Endometrial intraepithelial neoplasia (EIN)  Adenomyosis   Pelvic Washings: Negative. No evidence of malignancy  TUMOR    Tumor Size:    Greatest gross dimension (Centimeters): 4.5 cm     Histologic Type:    Endometrioid carcinoma     Histologic Grade:    FIGO grade 1     Adenomyosis:    Present, uninvolved by carcinoma     Uterine Serosal Involvement:    Not identified     Lower Uterine Segment Involvement:    Not identified     Cervical Involvement:    Not identified     Other Tissue / Organ Involvement:    Not identified (other tissues / organs submitted and not involved)     Peritoneal / Pelvic Washings / Ascitic Fluid:    Negative  for malignant cells     Lymphatic and / or Vascular Invasion:    Not identified   REGIONAL LYMPH NODES     Regional Lymph Node Status:          :    All regional lymph nodes negative for tumor cells         Lymph Nodes Examined:              Total Number of Pelvic Nodes Examined:    3            Number of Pelvic Sentinel Nodes Examined:    3           Total Number of Para-aortic Nodes Examined:    0   pTNM CLASSIFICATION (AJCC 8th Edition)     Reporting of pT, pN, and (when applicable) pM categories is based on information available to the pathologist at the time the report is issued. As per the AJCC (Chapter 1, 8th Ed.) it is the managing physician's responsibility to establish the final pathologic stage based upon all pertinent information, including but potentially not limited to this pathology report.     pT Category:    pT1a     pN Category:    pN0     N Suffix:    (sn)   FIGO STAGE     FIGO Stage (FIGO 2009 Staging / 2018 FIGO Cancer Report):    IA      % of Cells Staining Intensity Score Interpretation  ER IHC 42 2+ POSITIVE  PR IHC 51 3+ POSITIVE   P53 normal/wild-type   MMR/MSI are pending.   Case discussed at Tumor Board, however, awaiting review of pathology.   She has a CHEC subtype, without myometrial invasion, LVSI, or cervical stromal invasion. Recommendations: Observation. Recommend germline testing given age <50 years. Offer endometrial cancer consortium study.  Preoperatively she with low iron  saturation and received 3 doses of IV Venofer .  Postoperatively her hemoglobin was 9.1  Problem List: Patient Active Problem List   Diagnosis Date Noted   Iron  deficiency anemia 03/07/2024   Carcinosarcoma of uterus (HCC) 03/04/2024   Abnormal uterine bleeding (AUB) 02/28/2024   Thickened endometrium 02/28/2024   Cervical mass 02/28/2024   Menorrhagia 11/09/2023   Ganglion cyst 10/07/2021   HLD (hyperlipidemia) 12/17/2020   COVID-19 10/15/2019   Vertigo 01/24/2019   BMI 45.0-49.9, adult (HCC) 10/18/2018   Diabetes mellitus without complication (HCC) 10/09/2018   Nodule of skin of right hand 09/13/2018   Folliculitis 09/13/2018   Family history of bleeding disorder 12/05/2017   Hypothyroidism 12/05/2017   Other fatigue 12/05/2017   HTN (hypertension)  08/30/2016   Routine physical examination 08/30/2016    Past Medical History: Past Medical History:  Diagnosis Date   Asthma    AS A CHILD- NO INHALERS   Dizziness    happened when first diagnosed with hypertension   Elevated blood pressure reading 08/30/2016   Frequent headaches    GERD (gastroesophageal reflux disease)    OCC-TUMS PRN   Hypertension    RUQ pain 08/30/2016    Past Surgical History: Past Surgical History:  Procedure Laterality Date   CHOLECYSTECTOMY N/A 02/01/2017   Procedure: LAPAROSCOPIC CHOLECYSTECTOMY;  Surgeon: Shelva Dunnings, MD;  Location: [facility] ORS;  Service: General;  Laterality: N/A;    Past Gynecologic History:  Menarche: age 52 Menstrual details: lasts 5-6 days Menses: regular History of OCP use- yes around age 32. Discontinued Denies history of abnormal paps or  STIs  OB History:  OB History  Gravida Para Term Preterm AB Living  0 0 0 0 0 0  SAB IAB Ectopic Multiple Live Births  0 0 0 0 0   Family History: Family History  Problem Relation Age of Onset   Arthritis Mother    Hyperlipidemia Mother    Hypertension Mother    Breast cancer Mother 81       negative BRCA testing   Arthritis Father    Hyperlipidemia Father    Heart disease Father    Hypertension Father    GER disease Father    Hyperlipidemia Maternal Aunt    Hypertension Maternal Aunt    Diabetes Maternal Aunt    Breast cancer Maternal Aunt 40   Deep vein thrombosis Maternal Aunt    Cancer Maternal Uncle    Mental illness Paternal Aunt    Alcohol abuse Paternal Uncle    Cancer Paternal Uncle    Arthritis Maternal Grandmother    Hyperlipidemia Maternal Grandmother    Heart disease Maternal Grandmother    Hypertension Maternal Grandmother    Diabetes Maternal Grandmother    Breast cancer Maternal Grandmother 17   Arthritis Maternal Grandfather    Hyperlipidemia Maternal Grandfather    Alcohol abuse Paternal Grandmother    Arthritis Paternal Grandmother     Hyperlipidemia Paternal Grandmother    Hypertension Paternal Grandmother    Mental illness Paternal Grandmother    Alcohol abuse Paternal Grandfather    Arthritis Paternal Grandfather    Hyperlipidemia Paternal Grandfather    Colon cancer Neg Hx    Thyroid cancer Neg Hx    Social History: Social History   Socioeconomic History   Marital status: Single    Spouse name: Not on file   Number of children: Not on file   Years of education: Not on file   Highest education level: Bachelor's degree (e.g., BA, AB, BS)  Occupational History   Not on file  Tobacco Use   Smoking status: Never   Smokeless tobacco: Never  Vaping Use   Vaping status: Never Used  Substance and Sexual Activity   Alcohol use: Yes    Comment: RARE   Drug use: No   Sexual activity: Not Currently  Other Topics Concern   Not on file  Social History Narrative   Works at [employer]   Enjoys working in her garden   Social Drivers of Corporate investment banker Strain: Low Risk  (04/07/2024)   Overall Financial Resource Strain (CARDIA)    Difficulty of Paying Living Expenses: Not hard at all  Food Insecurity: No Food Insecurity (04/07/2024)   Hunger Vital Sign    Worried About Running Out of Food in the Last Year: Never true    Ran Out of Food in the Last Year: Never true  Transportation Needs: No Transportation Needs (04/07/2024)   PRAPARE - Administrator, Civil Service (Medical): No    Lack of Transportation (Non-Medical): No  Physical Activity: Unknown (04/07/2024)   Exercise Vital Sign    Days of Exercise per Week: Patient declined    Minutes of Exercise per Session: Not on file  Stress: Stress Concern Present (04/07/2024)   [employer] of Occupational Health - Occupational Stress Questionnaire    Feeling of Stress: Rather much  Social Connections: Socially Isolated (04/07/2024)   Social Connection and Isolation Panel    Frequency of Communication with Friends and Family: More than three  times a week  Frequency of Social Gatherings with Friends and Family: More than three times a week    Attends Religious Services: Patient declined    Active Member of Clubs or Organizations: No    Attends Engineer, structural: Not on file    Marital Status: Never married  Intimate Partner Violence: Not At Risk (03/20/2024)   Humiliation, Afraid, Rape, and Kick questionnaire    Fear of Current or Ex-Partner: No    Emotionally Abused: No    Physically Abused: No    Sexually Abused: No   Allergies: No Known Allergies  Current Medications: Current Outpatient Medications  Medication Sig Dispense Refill   amLODipine  (NORVASC ) 5 MG tablet TAKE 1 TABLET BY MOUTH DAILY 90 tablet 3   levothyroxine  (SYNTHROID ) 100 MCG tablet Take 100 mcg by mouth daily.     lisinopril  (ZESTRIL ) 20 MG tablet TAKE 1 TABLET BY MOUTH DAILY 90 tablet 3   metFORMIN  (GLUCOPHAGE -XR) 500 MG 24 hr tablet Take 2 tablets (1,000 mg total) by mouth 2 (two) times daily. 360 tablet 3   Multiple Vitamins-Minerals (EQ MULTIVITAMINS ADULT GUMMY PO) Take 2 tablets by mouth daily.     rosuvastatin  (CRESTOR ) 10 MG tablet TAKE 1 TABLET(10 MG) BY MOUTH DAILY 90 tablet 3   Semaglutide  (RYBELSUS ) 7 MG TABS TAKE 1 TABLET BY MOUTH DAILY 90 tablet 0   No current facility-administered medications for this visit.   Review of Systems General:  no complaints Skin: no complaints Eyes: no complaints HEENT: no complaints Breasts: no complaints Pulmonary: no complaints Cardiac: no complaints Gastrointestinal: abdominal pain Genitourinary/Sexual: no complaints Ob/Gyn: no complaints Musculoskeletal: no complaints Hematology: no complaints Neurologic/Psych: no complaints  Objective:  Physical Examination:  BP 132/72   Pulse (!) 108   Temp 98.6 F (37 C)   Resp 18   Wt 294 lb 4.8 oz (133.5 kg)   LMP 01/20/2024 (Exact Date)   SpO2 98%   BMI 43.46 kg/m    ECOG Performance Status: 1 - Symptomatic but completely  ambulatory  GENERAL: Patient is a well appearing female in no acute distress. Obese.  HEENT:  Sclerae anicteric.  Oropharynx clear and moist. Neck is supple.  LUNGS: Normal respiratory rate ABDOMEN:  Soft, nontender, nondistended. No ascites. No organomegaly palpated however, exam limited d/t habitus.  SKIN: Incisions all healing well.  EXTREMITIES:  No peripheral edema.   NEURO:  Nonfocal. Well oriented.  Appropriate affect.  Pelvic: Exam Chaperoned by CMA deferred   Lab Review   Radiologic Imaging: CT CHEST ABDOMEN PELVIS W CONTRAST CLINICAL DATA:  High-grade endometrial carcinosarcoma. Staging. * Tracking Code: BO *  EXAM: CT CHEST, ABDOMEN, AND PELVIS WITH CONTRAST  TECHNIQUE: Multidetector CT imaging of the chest, abdomen and pelvis was performed following the standard protocol during bolus administration of intravenous contrast.  RADIATION DOSE REDUCTION: This exam was performed according to the departmental dose-optimization program which includes automated exposure control, adjustment of the mA and/or kV according to patient size and/or use of iterative reconstruction technique.  CONTRAST:  OMNIPAQUE  IOHEXOL  350 MG/ML SOLN  COMPARISON:  Abdominal ultrasound 2017 December. Pelvic ultrasound 11/20/2023.  FINDINGS: CT CHEST FINDINGS  Cardiovascular: Heart is nonenlarged. No pericardial effusion. The thoracic aorta is normal course and caliber. Slight pulsation artifact along the ascending aorta.  Mediastinum/Nodes: The thoracic esophagus is normal course and caliber. Preserved thyroid gland. No specific abnormal lymph node enlargement identified in the chest including axillary regions, hilum or mediastinum. No supraclavicular nodes.  Lungs/Pleura: No consolidation, pneumothorax or effusion.  There are 2 calcified nodule left lower lobe series 3, image 107 and 110 consistent with old granulomatous disease.  Musculoskeletal: Mild degenerative changes  identified along the spine.  CT ABDOMEN PELVIS FINDINGS  Hepatobiliary: No focal liver abnormality is seen. Status post cholecystectomy. No biliary dilatation. Patent portal vein.  Pancreas: Unremarkable. No pancreatic ductal dilatation or surrounding inflammatory changes.  Spleen: Normal in size without focal abnormality.  Adrenals/Urinary Tract: Minimal nodular thickening along both adrenal glands on image 61, nonspecific. No enhancing renal mass or collecting system dilatation. The ureters have normal course and caliber extending down to the urinary bladder. Preserved contour to the urinary bladder.  Stomach/Bowel: On this non oral contrast exam, large bowel has a normal course and caliber. Scattered colonic stool. Normal appendix extends inferior to the cecum in the right lower quadrant. Moderate debris in the stomach. Small bowel has a normal course and caliber.  Vascular/Lymphatic: No specific abnormal lymph node enlargement identified in the abdomen and pelvis. Normal caliber aorta and IVC.  Reproductive: Uterus is present. There is some thickening of the endometrium. Please correlate for known history of endometrial carcinoma. No separate adnexal mass or parametrial lesion. No significant free fluid in the pelvis.  Other: No free intra-abdominal air.  Musculoskeletal: Scattered degenerative changes along the spine and pelvis. Posterior osteophytes seen at the L1-2 level with some central canal encroachment.  IMPRESSION: Thickening of the endometrium of the uterus. Please correlate with history of known neoplasm. No separate free fluid, adnexal mass or nodal enlargement.  Small calcified lung nodules consistent with old granulomatous disease.  Electronically Signed   By: Ranell Bring M.D.   On: 03/05/2024 13:29   Assessment:  Laurie Cameron is a 38 y.o. 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 CHEC  subtype, without myometrial invasion, LVSI, or cervical stromal invasion.   Brian Kocourek is a 38 y.o. G0P0 with PMH of T2DM, BMI 43.4, HTN, hypothyroidism who presented for AUB/HMB and was found to have homogeneously thickened endometrium without uterine masses on outside pelvic US  (11/20/23). Underwent cervical polypectomy and EMB on 02/27/24 ([facility]) with final outside pathology demonstrating malignant mixed mesodermal tumor involving both EMB and polyp specimen; patchy P16, +ER/PR, p53 WT. CT CAP (03/05/24) without evidence of metastatic disease. Underwent TLH, BSO, SLND, pelvic washings on 03/18/24. 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 Pelvic sentinel nodes, bilateral fallopian tubes/ovaries and washings negative for malignancy. In summary,     Molecular/genetic testing: P53 WT, ER 2+ (42%), PR 3+ (51%), MMR/MSI pending   Anemia, secondary to vaginal bleeding and iron  deficiency status post transfusion s/p iron  infusion.   Hyponatremia, hypokalemia, asymptomatic  Elevated AST  Medical co-morbidities complicating care: thyroid disease, HTN, diabetes, obesity, and prior abdominal surgery.  Plan:   Problem List Items Addressed This Visit       Genitourinary   Carcinosarcoma of uterus (HCC) - Primary     Here for further discussion of CHEC subtype.  We reviewed the slides at [facility] Tumor Board and that the [facility] subtype is associated with low grade behavior. Adjuvant therapy was not recommended.    She has some mild vasomotor symptoms, but not not interfering with sleep.  Recommend germline testing given age <50 years.   We have ordered genetic testing and results are pending.   She will RTC for cuff check in a month and in 4 months for surveillance.  The patient's diagnosis, an outline of the further diagnostic and laboratory studies which will be required, the  recommendation, and alternatives were discussed.  All questions were answered to the patient's satisfaction.  Prentice Agent, MD  CC:   Estil Mangle, DO

## 2024-04-18 ENCOUNTER — Telehealth: Payer: Self-pay

## 2024-04-18 NOTE — Telephone Encounter (Signed)
 Called and left voicemail to return call. Need to move appointment up for formal post op check.

## 2024-04-21 ENCOUNTER — Encounter: Payer: Self-pay | Admitting: Licensed Clinical Social Worker

## 2024-04-21 ENCOUNTER — Telehealth: Payer: Self-pay | Admitting: Nurse Practitioner

## 2024-04-21 DIAGNOSIS — Z1379 Encounter for other screening for genetic and chromosomal anomalies: Secondary | ICD-10-CM | POA: Insufficient documentation

## 2024-04-21 NOTE — Telephone Encounter (Signed)
 Reviewed genetic testing results which were negative.

## 2024-04-23 ENCOUNTER — Other Ambulatory Visit: Payer: Self-pay | Admitting: Family

## 2024-04-23 ENCOUNTER — Ambulatory Visit

## 2024-04-23 DIAGNOSIS — E119 Type 2 diabetes mellitus without complications: Secondary | ICD-10-CM

## 2024-04-30 ENCOUNTER — Inpatient Hospital Stay: Attending: Obstetrics and Gynecology

## 2024-04-30 VITALS — BP 121/75 | HR 96 | Temp 97.8°F | Resp 19 | Wt 299.1 lb

## 2024-04-30 DIAGNOSIS — Z818 Family history of other mental and behavioral disorders: Secondary | ICD-10-CM | POA: Insufficient documentation

## 2024-04-30 DIAGNOSIS — Z79899 Other long term (current) drug therapy: Secondary | ICD-10-CM | POA: Insufficient documentation

## 2024-04-30 DIAGNOSIS — R9389 Abnormal findings on diagnostic imaging of other specified body structures: Secondary | ICD-10-CM | POA: Insufficient documentation

## 2024-04-30 DIAGNOSIS — E119 Type 2 diabetes mellitus without complications: Secondary | ICD-10-CM | POA: Insufficient documentation

## 2024-04-30 DIAGNOSIS — E876 Hypokalemia: Secondary | ICD-10-CM | POA: Insufficient documentation

## 2024-04-30 DIAGNOSIS — Z9049 Acquired absence of other specified parts of digestive tract: Secondary | ICD-10-CM | POA: Insufficient documentation

## 2024-04-30 DIAGNOSIS — Z811 Family history of alcohol abuse and dependence: Secondary | ICD-10-CM | POA: Insufficient documentation

## 2024-04-30 DIAGNOSIS — Z803 Family history of malignant neoplasm of breast: Secondary | ICD-10-CM | POA: Diagnosis not present

## 2024-04-30 DIAGNOSIS — Z79890 Hormone replacement therapy (postmenopausal): Secondary | ICD-10-CM | POA: Diagnosis not present

## 2024-04-30 DIAGNOSIS — Z8261 Family history of arthritis: Secondary | ICD-10-CM | POA: Diagnosis not present

## 2024-04-30 DIAGNOSIS — E871 Hypo-osmolality and hyponatremia: Secondary | ICD-10-CM | POA: Insufficient documentation

## 2024-04-30 DIAGNOSIS — Z83438 Family history of other disorder of lipoprotein metabolism and other lipidemia: Secondary | ICD-10-CM | POA: Diagnosis not present

## 2024-04-30 DIAGNOSIS — D509 Iron deficiency anemia, unspecified: Secondary | ICD-10-CM | POA: Diagnosis present

## 2024-04-30 DIAGNOSIS — Z833 Family history of diabetes mellitus: Secondary | ICD-10-CM | POA: Insufficient documentation

## 2024-04-30 DIAGNOSIS — E039 Hypothyroidism, unspecified: Secondary | ICD-10-CM | POA: Insufficient documentation

## 2024-04-30 DIAGNOSIS — Z8249 Family history of ischemic heart disease and other diseases of the circulatory system: Secondary | ICD-10-CM | POA: Diagnosis not present

## 2024-04-30 DIAGNOSIS — I10 Essential (primary) hypertension: Secondary | ICD-10-CM | POA: Insufficient documentation

## 2024-04-30 DIAGNOSIS — Z90722 Acquired absence of ovaries, bilateral: Secondary | ICD-10-CM | POA: Insufficient documentation

## 2024-04-30 DIAGNOSIS — Z809 Family history of malignant neoplasm, unspecified: Secondary | ICD-10-CM | POA: Diagnosis not present

## 2024-04-30 DIAGNOSIS — E785 Hyperlipidemia, unspecified: Secondary | ICD-10-CM | POA: Insufficient documentation

## 2024-04-30 DIAGNOSIS — Z8379 Family history of other diseases of the digestive system: Secondary | ICD-10-CM | POA: Insufficient documentation

## 2024-04-30 DIAGNOSIS — E669 Obesity, unspecified: Secondary | ICD-10-CM | POA: Diagnosis not present

## 2024-04-30 DIAGNOSIS — C541 Malignant neoplasm of endometrium: Secondary | ICD-10-CM | POA: Insufficient documentation

## 2024-04-30 DIAGNOSIS — Z90710 Acquired absence of both cervix and uterus: Secondary | ICD-10-CM | POA: Diagnosis not present

## 2024-04-30 DIAGNOSIS — N8003 Adenomyosis of the uterus: Secondary | ICD-10-CM | POA: Diagnosis not present

## 2024-04-30 DIAGNOSIS — Z8616 Personal history of COVID-19: Secondary | ICD-10-CM | POA: Diagnosis not present

## 2024-04-30 NOTE — Progress Notes (Signed)
 Gynecologic Oncology Interval Visit   Referring Provider: Estil Mangle, DO   Chief Concern: Carcinosarcoma  Subjective:  Laurie Cameron is a 38 y.o. female, initially seen in consultation from Dr. 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   She feels well and denies complaints.  No bleeding or spotting.  Has some ongoing abdominal discomfort since returning to work that seems to be associated with sitting.  Controlled with Aleve.    Gynecologic Oncology History: She initially presented as a 38 year old female with two episodes of AUB/HMB (Jan/Apr-May '25) and with thickened endometrium on Feb '25 pelvic US  and L ovarian enlargement.   11/20/23 US  PELVIS FINDINGS: Uterus - anteverted, 10 x 7 x 6 cm. The endometrium homogeneously thickened. The uterine cavity is empty. There are no uterine masses. Right ovary: Unremarkable, 4.6 x 4.1 x 3.2 cm. Left ovary: Enlarged, 6.0 x 4.4 x 3.7 cm. Images of the adnexae demonstrated no masses or fluid collections. Color Doppler demonstrated ovarian blood flow. IMPRESSION: 1. Thickened endometrium. 2. Enlarged left ovary.  02/27/2024  Cervical polypectomy and EMBx performed.  MALIGNANT MIXED MESODERMAL TUMOR (CARCINOSARCOMA). SEE COMMENT.  Immunostain p16 is patchy while ER and PR are positive. Immunostain p53 shows the wild-type (non-mutated) pattern.   02/27/24- Pap-epithelial cell abnormality, atypical squamous cells, cannot exclude high-grade squamous intraepithelial lesion (ASC-H).  HPV negative  Of not she has a h/o iron  deficiency anemia and required a blood transfusion on 02/02/2024 for a hemoglobin of 8.3.  03/05/2024 CT chest abdomen pelvis with contrast Thickening of the endometrium measurement provided.  Consistent with patient's known history of endometrial carcinoma.  No adnexal masses are.  Trillat lesions.  No  significant free fluid in the pelvis.  No lymphadenopathy.  No presence of metastatic disease. Small calcified lung nodules consistent with old granulomatous disease  03/14/2024-TLH BSO with mapping and biopsies with Dr. Mancil at [facility]  Surgical Pathology:  A.  Sentinel lymph node, Left, external iliac, biopsy: - One lymph node, negative for malignancy (0/1).     B.  Sentinel lymph node, right, external iliac, biopsy: - Two lymph nodes, negative for malignancy (0/2).   C.  Uterus and bilateral ovaries and fallopian tubes, hysterectomy and bilateral salpingo-oophorectomy: - Endometrioid adenocarcinoma of the endometrium, FIGO grade 1 of 3, corded and hyalinized (CHEC) type.  - Myometrial invasion: absent.  - Lymphatic/vascular invasion: absent.  - Cervical stromal invasion: absent. - Ovaries and fallopian tubes, bilateral: Negative for malignancy.  Additional findings: Endometrial intraepithelial neoplasia (EIN)  Adenomyosis   Pelvic Washings: Negative. No evidence of malignancy  TUMOR    Tumor Size:    Greatest gross dimension (Centimeters): 4.5 cm     Histologic Type:    Endometrioid carcinoma     Histologic Grade:    FIGO grade 1     Adenomyosis:    Present, uninvolved by carcinoma     Uterine Serosal Involvement:    Not identified     Lower Uterine Segment Involvement:    Not identified     Cervical Involvement:    Not identified     Other Tissue / Organ Involvement:    Not identified (other tissues / organs submitted and not involved)     Peritoneal / Pelvic Washings / Ascitic Fluid:    Negative for malignant cells     Lymphatic and / or Vascular Invasion:  Not identified   REGIONAL LYMPH NODES     Regional Lymph Node Status:          :    All regional lymph nodes negative for tumor cells         Lymph Nodes Examined:              Total Number of Pelvic Nodes Examined:    3           Number of Pelvic Sentinel Nodes Examined:    3           Total Number of Para-aortic  Nodes Examined:    0   pTNM CLASSIFICATION (AJCC 8th Edition)     Reporting of pT, pN, and (when applicable) pM categories is based on information available to the pathologist at the time the report is issued. As per the AJCC (Chapter 1, 8th Ed.) it is the managing physician's responsibility to establish the final pathologic stage based upon all pertinent information, including but potentially not limited to this pathology report.     pT Category:    pT1a     pN Category:    pN0     N Suffix:    (sn)   FIGO STAGE     FIGO Stage (FIGO 2009 Staging / 2018 FIGO Cancer Report):    IA      % of Cells Staining Intensity Score Interpretation  ER IHC 42 2+ POSITIVE  PR IHC 51 3+ POSITIVE   P53 normal/wild-type   MMR/MSI are pending.   Case discussed at Tumor Board, however, awaiting review of pathology.   She has a CHEC subtype, without myometrial invasion, LVSI, or cervical stromal invasion. Recommendations: Observation. Recommend germline testing given age <50 years. Offer endometrial cancer consortium study.  Preoperatively she with low iron  saturation and received 3 doses of IV Venofer .  Postoperatively her hemoglobin was 9.1  We previously discussed her slides at [facility] tumor board and that results of C AJCC associated with low-grade behavior.  Adjuvant therapy was not recommended.    Problem List: Patient Active Problem List   Diagnosis Date Noted   Endometrial cancer (HCC) 04/30/2024   Genetic testing 04/21/2024   Iron  deficiency anemia 03/07/2024   Abnormal uterine bleeding (AUB) 02/28/2024   Thickened endometrium 02/28/2024   Cervical mass 02/28/2024   Menorrhagia 11/09/2023   Ganglion cyst 10/07/2021   HLD (hyperlipidemia) 12/17/2020   COVID-19 10/15/2019   Vertigo 01/24/2019   BMI 45.0-49.9, adult (HCC) 10/18/2018   Diabetes mellitus without complication (HCC) 10/09/2018   Nodule of skin of right hand 09/13/2018   Folliculitis 09/13/2018   Family history of bleeding  disorder 12/05/2017   Hypothyroidism 12/05/2017   Other fatigue 12/05/2017   HTN (hypertension) 08/30/2016   Routine physical examination 08/30/2016    Past Medical History: Past Medical History:  Diagnosis Date   Asthma    AS A CHILD- NO INHALERS   Dizziness    happened when first diagnosed with hypertension   Elevated blood pressure reading 08/30/2016   Frequent headaches    GERD (gastroesophageal reflux disease)    OCC-TUMS PRN   Hypertension    RUQ pain 08/30/2016    Past Surgical History: Past Surgical History:  Procedure Laterality Date   CHOLECYSTECTOMY N/A 02/01/2017   Procedure: LAPAROSCOPIC CHOLECYSTECTOMY;  Surgeon: Shelva Dunnings, MD;  Location: [facility] ORS;  Service: General;  Laterality: N/A;    Past Gynecologic History:  Menarche: age 38 Menstrual details: lasts 5-6 days Menses:  regular History of OCP use- yes around age 52. Discontinued Denies history of abnormal paps or STIs  OB History:  OB History  Gravida Para Term Preterm AB Living  0 0 0 0 0 0  SAB IAB Ectopic Multiple Live Births  0 0 0 0 0   Family History: Family History  Problem Relation Age of Onset   Arthritis Mother    Hyperlipidemia Mother    Hypertension Mother    Breast cancer Mother 64       negative BRCA testing   Arthritis Father    Hyperlipidemia Father    Heart disease Father    Hypertension Father    GER disease Father    Hyperlipidemia Maternal Aunt    Hypertension Maternal Aunt    Diabetes Maternal Aunt    Breast cancer Maternal Aunt 40   Deep vein thrombosis Maternal Aunt    Cancer Maternal Uncle    Mental illness Paternal Aunt    Alcohol abuse Paternal Uncle    Cancer Paternal Uncle    Arthritis Maternal Grandmother    Hyperlipidemia Maternal Grandmother    Heart disease Maternal Grandmother    Hypertension Maternal Grandmother    Diabetes Maternal Grandmother    Breast cancer Maternal Grandmother 29   Arthritis Maternal Grandfather    Hyperlipidemia  Maternal Grandfather    Alcohol abuse Paternal Grandmother    Arthritis Paternal Grandmother    Hyperlipidemia Paternal Grandmother    Hypertension Paternal Grandmother    Mental illness Paternal Grandmother    Alcohol abuse Paternal Grandfather    Arthritis Paternal Grandfather    Hyperlipidemia Paternal Grandfather    Colon cancer Neg Hx    Thyroid cancer Neg Hx    Social History: Social History   Socioeconomic History   Marital status: Single    Spouse name: Not on file   Number of children: Not on file   Years of education: Not on file   Highest education level: Bachelor's degree (e.g., BA, AB, BS)  Occupational History   Not on file  Tobacco Use   Smoking status: Never   Smokeless tobacco: Never  Vaping Use   Vaping status: Never Used  Substance and Sexual Activity   Alcohol use: Yes    Comment: RARE   Drug use: No   Sexual activity: Not Currently  Other Topics Concern   Not on file  Social History Narrative   Works at [employer]   Enjoys working in her garden   Social Drivers of Corporate investment banker Strain: Low Risk  (04/07/2024)   Overall Financial Resource Strain (CARDIA)    Difficulty of Paying Living Expenses: Not hard at all  Food Insecurity: No Food Insecurity (04/07/2024)   Hunger Vital Sign    Worried About Running Out of Food in the Last Year: Never true    Ran Out of Food in the Last Year: Never true  Transportation Needs: No Transportation Needs (04/07/2024)   PRAPARE - Administrator, Civil Service (Medical): No    Lack of Transportation (Non-Medical): No  Physical Activity: Unknown (04/07/2024)   Exercise Vital Sign    Days of Exercise per Week: Patient declined    Minutes of Exercise per Session: Not on file  Stress: Stress Concern Present (04/07/2024)   [employer] of Occupational Health - Occupational Stress Questionnaire    Feeling of Stress: Rather much  Social Connections: Socially Isolated (04/07/2024)   Social  Connection and Isolation Panel  Frequency of Communication with Friends and Family: More than three times a week    Frequency of Social Gatherings with Friends and Family: More than three times a week    Attends Religious Services: Patient declined    Active Member of Clubs or Organizations: No    Attends Engineer, structural: Not on file    Marital Status: Never married  Intimate Partner Violence: Not At Risk (03/20/2024)   Humiliation, Afraid, Rape, and Kick questionnaire    Fear of Current or Ex-Partner: No    Emotionally Abused: No    Physically Abused: No    Sexually Abused: No   Allergies: No Known Allergies  Current Medications: Current Outpatient Medications  Medication Sig Dispense Refill   amLODipine  (NORVASC ) 5 MG tablet TAKE 1 TABLET BY MOUTH DAILY 90 tablet 3   levothyroxine  (SYNTHROID ) 100 MCG tablet Take 100 mcg by mouth daily.     lisinopril  (ZESTRIL ) 20 MG tablet TAKE 1 TABLET BY MOUTH DAILY 90 tablet 3   metFORMIN  (GLUCOPHAGE -XR) 500 MG 24 hr tablet Take 2 tablets (1,000 mg total) by mouth 2 (two) times daily. 360 tablet 3   Multiple Vitamins-Minerals (EQ MULTIVITAMINS ADULT GUMMY PO) Take 2 tablets by mouth daily.     rosuvastatin  (CRESTOR ) 10 MG tablet TAKE 1 TABLET BY MOUTH DAILY 90 tablet 3   Semaglutide  (RYBELSUS ) 7 MG TABS TAKE 1 TABLET BY MOUTH DAILY 90 tablet 0   No current facility-administered medications for this visit.   Review of Systems General:  no complaints Skin: no complaints Eyes: no complaints HEENT: no complaints Breasts: no complaints Pulmonary: no complaints Cardiac: no complaints Gastrointestinal: abdominal pain Genitourinary/Sexual: no complaints Ob/Gyn: no complaints Musculoskeletal: no complaints Hematology: no complaints Neurologic/Psych: no complaints  Objective:  Physical Examination:  BP 121/75   Pulse 96   Temp 97.8 F (36.6 C)   Resp 19   Wt 299 lb 1.6 oz (135.7 kg)   LMP 01/20/2024 (Exact Date)   SpO2  98%   BMI 44.17 kg/m    ECOG Performance Status: 1 - Symptomatic but completely ambulatory  GENERAL: Patient is a well appearing female in no acute distress. Obese.  LUNGS: No audible wheezing or cough. SKIN: Incisions all well-healed EXTREMITIES:  No peripheral edema.   NEURO:  Nonfocal. Well oriented.  Appropriate affect.  Pelvic: Exam Chaperoned by CMA EGBUS: no lesions Vagina: Long speculum used.  Vaginal cuff is now well-healed. Cervix, uterus-surgically absent BME-vaginal cuff that is irregular but soft and nontender Rectovaginal: Not indicated  Lab Review    Radiologic Imaging:   Assessment:  Laurie Cameron is a 39 y.o. 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 CHEC subtype, without myometrial invasion, LVSI, or cervical stromal invasion.   Laurie Cameron is a 38 y.o. G0P0 with PMH of T2DM, BMI 43.4, HTN, hypothyroidism who presented for AUB/HMB and was found to have homogeneously thickened endometrium without uterine masses on outside pelvic US  (11/20/23). Underwent cervical polypectomy and EMB on 02/27/24 ([place]) with final outside pathology demonstrating malignant mixed mesodermal tumor involving both EMB and polyp specimen; patchy P16, +ER/PR, p53 WT. CT CAP (03/05/24) without evidence of metastatic disease. Underwent TLH, BSO, SLND, pelvic washings on 03/18/24. 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 Pelvic sentinel nodes, bilateral fallopian tubes/ovaries and washings negative for malignancy.    Molecular/genetic testing: P53 WT, ER 2+ (42%), PR 3+ (51%), MMR/MSI  pending   Anemia, secondary to vaginal bleeding and iron  deficiency status post transfusion s/p iron  infusion.   Hyponatremia, hypokalemia, asymptomatic  Elevated AST  Medical co-morbidities complicating care: thyroid disease, HTN,  diabetes, obesity, and prior abdominal surgery.  Plan:   Problem List Items Addressed This Visit       Genitourinary   Endometrial cancer (HCC) - Primary   We have deviously discussed the CHEC subtype.  Her case was discussed at the [facility] tumor board and felt that this subtype was associated with a low-grade behavior.  Adjuvant therapy was not recommended.  Vaginal cuff has now healed and she will return to clinic in 3 months for continued surveillance.  We discussed symptoms that would be concerning for recurrent disease and warrant sooner return..   Her germline testing was negative.  The patient's diagnosis, an outline of the further diagnostic and laboratory studies which will be required, the recommendation, and alternatives were discussed.  All questions were answered to the patient's satisfaction.  Laurie KANDICE Dawn, NP  CC:   Estil Mangle, DO

## 2024-05-13 ENCOUNTER — Other Ambulatory Visit: Payer: Self-pay

## 2024-05-13 DIAGNOSIS — C541 Malignant neoplasm of endometrium: Secondary | ICD-10-CM

## 2024-05-13 DIAGNOSIS — D500 Iron deficiency anemia secondary to blood loss (chronic): Secondary | ICD-10-CM

## 2024-05-14 ENCOUNTER — Inpatient Hospital Stay

## 2024-05-14 ENCOUNTER — Inpatient Hospital Stay (HOSPITAL_BASED_OUTPATIENT_CLINIC_OR_DEPARTMENT_OTHER): Admitting: Oncology

## 2024-05-14 ENCOUNTER — Encounter: Payer: Self-pay | Admitting: Oncology

## 2024-05-14 VITALS — BP 147/91 | HR 94 | Temp 98.0°F | Resp 16 | Wt 301.0 lb

## 2024-05-14 DIAGNOSIS — D508 Other iron deficiency anemias: Secondary | ICD-10-CM

## 2024-05-14 DIAGNOSIS — C541 Malignant neoplasm of endometrium: Secondary | ICD-10-CM

## 2024-05-14 DIAGNOSIS — D500 Iron deficiency anemia secondary to blood loss (chronic): Secondary | ICD-10-CM

## 2024-05-14 LAB — CBC (CANCER CENTER ONLY)
HCT: 36 % (ref 36.0–46.0)
Hemoglobin: 11.4 g/dL — ABNORMAL LOW (ref 12.0–15.0)
MCH: 26.1 pg (ref 26.0–34.0)
MCHC: 31.7 g/dL (ref 30.0–36.0)
MCV: 82.4 fL (ref 80.0–100.0)
Platelet Count: 237 K/uL (ref 150–400)
RBC: 4.37 MIL/uL (ref 3.87–5.11)
RDW: 18.4 % — ABNORMAL HIGH (ref 11.5–15.5)
WBC Count: 7.8 K/uL (ref 4.0–10.5)
nRBC: 0 % (ref 0.0–0.2)

## 2024-05-14 LAB — IRON AND TIBC
Iron: 31 ug/dL (ref 28–170)
Saturation Ratios: 9 % — ABNORMAL LOW (ref 10.4–31.8)
TIBC: 361 ug/dL (ref 250–450)
UIBC: 330 ug/dL

## 2024-05-14 LAB — FERRITIN: Ferritin: 98 ng/mL (ref 11–307)

## 2024-05-14 NOTE — Progress Notes (Signed)
 Hematology/Oncology Consult note [facility]  Telephone:([phone] [phone]  Patient Care Team: Dineen Rollene MATSU, FNP as PCP - General (Family Medicine) Maurie Rayfield BIRCH, RN as Oncology Nurse Navigator Melanee Annah BROCKS, MD as Consulting Physician (Oncology)   Name of the patient: Laurie Cameron  [id number]  [birth year]   Date of visit: 05/14/24  Diagnosis-iron  deficiency anemia  Chief complaint/ Reason for visit-routine follow-up of iron  deficiency anemia  Heme/Onc history: Patient is a 38 year old female who was found to have iron  deficiency anemia in June 2025 secondary to endometrial mass.  She subsequently underwent TAH/BSO.  Although biopsy showed findings concerning for carcinosarcoma final pathology showed grade 1 endometrioid endometrial carcinoma for which she follows up with GYN oncology and did not require any adjuvant chemotherapy.  She received IV iron  in June 2025 for iron  deficiency.  Interval history-she is presently recovering well from her recent surgery and reports being sore but is able to get back to her work  ECOG PS- 0 Pain scale- 0   Review of systems- Review of Systems  Constitutional:  Negative for chills, fever, malaise/fatigue and weight loss.  HENT:  Negative for congestion, ear discharge and nosebleeds.   Eyes:  Negative for blurred vision.  Respiratory:  Negative for cough, hemoptysis, sputum production, shortness of breath and wheezing.   Cardiovascular:  Negative for chest pain, palpitations, orthopnea and claudication.  Gastrointestinal:  Negative for abdominal pain, blood in stool, constipation, diarrhea, heartburn, melena, nausea and vomiting.  Genitourinary:  Negative for dysuria, flank pain, frequency, hematuria and urgency.  Musculoskeletal:  Negative for back pain, joint pain and myalgias.  Skin:  Negative for rash.  Neurological:  Negative for dizziness, tingling, focal weakness, seizures, weakness  and headaches.  Endo/Heme/Allergies:  Does not bruise/bleed easily.  Psychiatric/Behavioral:  Negative for depression and suicidal ideas. The patient does not have insomnia.       No Known Allergies   Past Medical History:  Diagnosis Date   Asthma    AS A CHILD- NO INHALERS   Dizziness    happened when first diagnosed with hypertension   Elevated blood pressure reading 08/30/2016   Frequent headaches    GERD (gastroesophageal reflux disease)    OCC-TUMS PRN   Hypertension    RUQ pain 08/30/2016     Past Surgical History:  Procedure Laterality Date   CHOLECYSTECTOMY N/A 02/01/2017   Procedure: LAPAROSCOPIC CHOLECYSTECTOMY;  Surgeon: Shelva Dunnings, MD;  Location: [facility] ORS;  Service: General;  Laterality: N/A;    Social History   Socioeconomic History   Marital status: Single    Spouse name: Not on file   Number of children: Not on file   Years of education: Not on file   Highest education level: Bachelor's degree (e.g., BA, AB, BS)  Occupational History   Not on file  Tobacco Use   Smoking status: Never   Smokeless tobacco: Never  Vaping Use   Vaping status: Never Used  Substance and Sexual Activity   Alcohol use: Yes    Comment: RARE   Drug use: No   Sexual activity: Not Currently  Other Topics Concern   Not on file  Social History Narrative   Works at [employer]   Enjoys working in her garden   Social Drivers of Corporate investment banker Strain: Low Risk  (04/07/2024)   Overall Financial Resource Strain (CARDIA)    Difficulty of Paying Living Expenses: Not hard at all  Food Insecurity: No  Food Insecurity (04/07/2024)   Hunger Vital Sign    Worried About Running Out of Food in the Last Year: Never true    Ran Out of Food in the Last Year: Never true  Transportation Needs: No Transportation Needs (04/07/2024)   PRAPARE - Administrator, Civil Service (Medical): No    Lack of Transportation (Non-Medical): No  Physical Activity: Unknown  (04/07/2024)   Exercise Vital Sign    Days of Exercise per Week: Patient declined    Minutes of Exercise per Session: Not on file  Stress: Stress Concern Present (04/07/2024)   [employer] of Occupational Health - Occupational Stress Questionnaire    Feeling of Stress: Rather much  Social Connections: Socially Isolated (04/07/2024)   Social Connection and Isolation Panel    Frequency of Communication with Friends and Family: More than three times a week    Frequency of Social Gatherings with Friends and Family: More than three times a week    Attends Religious Services: Patient declined    Active Member of Clubs or Organizations: No    Attends Engineer, structural: Not on file    Marital Status: Never married  Intimate Partner Violence: Not At Risk (03/20/2024)   Humiliation, Afraid, Rape, and Kick questionnaire    Fear of Current or Ex-Partner: No    Emotionally Abused: No    Physically Abused: No    Sexually Abused: No    Family History  Problem Relation Age of Onset   Arthritis Mother    Hyperlipidemia Mother    Hypertension Mother    Breast cancer Mother 22       negative BRCA testing   Arthritis Father    Hyperlipidemia Father    Heart disease Father    Hypertension Father    GER disease Father    Hyperlipidemia Maternal Aunt    Hypertension Maternal Aunt    Diabetes Maternal Aunt    Breast cancer Maternal Aunt 40   Deep vein thrombosis Maternal Aunt    Cancer Maternal Uncle    Mental illness Paternal Aunt    Alcohol abuse Paternal Uncle    Cancer Paternal Uncle    Arthritis Maternal Grandmother    Hyperlipidemia Maternal Grandmother    Heart disease Maternal Grandmother    Hypertension Maternal Grandmother    Diabetes Maternal Grandmother    Breast cancer Maternal Grandmother 52   Arthritis Maternal Grandfather    Hyperlipidemia Maternal Grandfather    Alcohol abuse Paternal Grandmother    Arthritis Paternal Grandmother    Hyperlipidemia Paternal  Grandmother    Hypertension Paternal Grandmother    Mental illness Paternal Grandmother    Alcohol abuse Paternal Grandfather    Arthritis Paternal Grandfather    Hyperlipidemia Paternal Grandfather    Colon cancer Neg Hx    Thyroid cancer Neg Hx      Current Outpatient Medications:    amLODipine  (NORVASC ) 5 MG tablet, TAKE 1 TABLET BY MOUTH DAILY, Disp: 90 tablet, Rfl: 3   levothyroxine  (SYNTHROID ) 100 MCG tablet, Take 100 mcg by mouth daily., Disp: , Rfl:    lisinopril  (ZESTRIL ) 20 MG tablet, TAKE 1 TABLET BY MOUTH DAILY, Disp: 90 tablet, Rfl: 3   metFORMIN  (GLUCOPHAGE -XR) 500 MG 24 hr tablet, Take 2 tablets (1,000 mg total) by mouth 2 (two) times daily., Disp: 360 tablet, Rfl: 3   Multiple Vitamins-Minerals (EQ MULTIVITAMINS ADULT GUMMY PO), Take 2 tablets by mouth daily., Disp: , Rfl:    rosuvastatin  (CRESTOR ) 10  MG tablet, TAKE 1 TABLET BY MOUTH DAILY, Disp: 90 tablet, Rfl: 3   Semaglutide  (RYBELSUS ) 7 MG TABS, TAKE 1 TABLET BY MOUTH DAILY, Disp: 90 tablet, Rfl: 0  Physical exam:  Vitals:   05/14/24 1506  BP: (!) 147/91  Pulse: 94  Resp: 16  Temp: 98 F (36.7 C)  TempSrc: Tympanic  SpO2: 99%  Weight: (!) 301 lb (136.5 kg)   Physical Exam Cardiovascular:     Rate and Rhythm: Normal rate and regular rhythm.     Heart sounds: Normal heart sounds.  Pulmonary:     Effort: Pulmonary effort is normal.     Breath sounds: Normal breath sounds.  Skin:    General: Skin is warm and dry.  Neurological:     Mental Status: She is alert and oriented to person, place, and time.      I have personally reviewed labs listed below:    Latest Ref Rng & Units 02/02/2024    2:18 PM  CMP  Glucose 70 - 99 mg/dL 795   BUN 6 - 20 mg/dL 11   Creatinine 9.55 - 1.00 mg/dL 9.10   Sodium 864 - 854 mmol/L 136   Potassium 3.5 - 5.1 mmol/L 4.3   Chloride 98 - 111 mmol/L 104   CO2 22 - 32 mmol/L 21   Calcium  8.9 - 10.3 mg/dL 8.8   Total Protein 6.5 - 8.1 g/dL 7.4   Total Bilirubin 0.0 -  1.2 mg/dL 1.0   Alkaline Phos 38 - 126 U/L 37   AST 15 - 41 U/L 36   ALT 0 - 44 U/L 15       Latest Ref Rng & Units 05/14/2024    2:51 PM  CBC  WBC 4.0 - 10.5 K/uL 7.8   Hemoglobin 12.0 - 15.0 g/dL 88.5   Hematocrit 63.9 - 46.0 % 36.0   Platelets 150 - 400 K/uL 237      Assessment and plan- Patient is a 38 y.o. female here for routine follow-up of iron  deficiency anemia  Hemoglobin has improved from 8.3 presently to 11.4.Iron  studies are presently pending.  Iron  deficiency was secondary to endometrial cancer which was operated upon and therefore it is unlikely that patient will require any IV iron  in the tube.  She did not require any adjuvant treatment after her surgery for endometrial cancer and will continue to follow-up with GYN oncology as well.  She can be referred to us  in the future if questions or concerns arise   Visit Diagnosis 1. Other iron  deficiency anemia      Dr. Annah Skene, MD, MPH [facility] [phone] 05/14/2024 4:07 PM

## 2024-06-24 ENCOUNTER — Other Ambulatory Visit: Payer: Self-pay | Admitting: Family

## 2024-06-24 DIAGNOSIS — E119 Type 2 diabetes mellitus without complications: Secondary | ICD-10-CM

## 2024-07-20 ENCOUNTER — Other Ambulatory Visit: Payer: Self-pay | Admitting: Family

## 2024-07-20 DIAGNOSIS — E119 Type 2 diabetes mellitus without complications: Secondary | ICD-10-CM

## 2024-07-20 DIAGNOSIS — I10 Essential (primary) hypertension: Secondary | ICD-10-CM

## 2024-07-28 ENCOUNTER — Other Ambulatory Visit: Payer: Self-pay | Admitting: Family

## 2024-07-28 ENCOUNTER — Encounter: Payer: Self-pay | Admitting: Family

## 2024-07-28 MED ORDER — RYBELSUS 7 MG PO TABS: 1.0000 | ORAL_TABLET | Freq: Every day | ORAL | 3 refills | Status: AC

## 2024-07-30 ENCOUNTER — Encounter: Payer: Self-pay | Admitting: Obstetrics and Gynecology

## 2024-07-30 ENCOUNTER — Ambulatory Visit

## 2024-07-30 ENCOUNTER — Inpatient Hospital Stay: Attending: Obstetrics and Gynecology | Admitting: Obstetrics and Gynecology

## 2024-07-30 VITALS — BP 133/74 | HR 96 | Temp 97.6°F | Ht 68.0 in | Wt 296.2 lb

## 2024-07-30 DIAGNOSIS — Z9079 Acquired absence of other genital organ(s): Secondary | ICD-10-CM | POA: Diagnosis not present

## 2024-07-30 DIAGNOSIS — Z90710 Acquired absence of both cervix and uterus: Secondary | ICD-10-CM | POA: Insufficient documentation

## 2024-07-30 DIAGNOSIS — Z90722 Acquired absence of ovaries, bilateral: Secondary | ICD-10-CM | POA: Diagnosis not present

## 2024-07-30 DIAGNOSIS — Z8542 Personal history of malignant neoplasm of other parts of uterus: Secondary | ICD-10-CM | POA: Insufficient documentation

## 2024-07-30 DIAGNOSIS — C541 Malignant neoplasm of endometrium: Secondary | ICD-10-CM

## 2024-07-30 NOTE — Progress Notes (Signed)
 Gynecologic Oncology Interval Visit   Referring Provider: Estil Mangle, DO   Chief Concern: Carcinosarcoma  Subjective:  Laurie Cameron is a 38 y.o. female, initially seen in consultation from Dr. 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   She feels well and denies complaints.  No bleeding or spotting.   Gynecologic Oncology History: She initially presented as a 38 year old female with two episodes of AUB/HMB (Jan/Apr-May '25) and with thickened endometrium on Feb '25 pelvic US  and L ovarian enlargement.   11/20/23 US  PELVIS FINDINGS: Uterus - anteverted, 10 x 7 x 6 cm. The endometrium homogeneously thickened. The uterine cavity is empty. There are no uterine masses. Right ovary: Unremarkable, 4.6 x 4.1 x 3.2 cm. Left ovary: Enlarged, 6.0 x 4.4 x 3.7 cm. Images of the adnexae demonstrated no masses or fluid collections. Color Doppler demonstrated ovarian blood flow. IMPRESSION: 1. Thickened endometrium. 2. Enlarged left ovary.  02/27/2024  Cervical polypectomy and EMBx performed.  MALIGNANT MIXED MESODERMAL TUMOR (CARCINOSARCOMA). SEE COMMENT.  Immunostain p16 is patchy while ER and PR are positive. Immunostain p53 shows the wild-type (non-mutated) pattern.   02/27/24- Pap-epithelial cell abnormality, atypical squamous cells, cannot exclude high-grade squamous intraepithelial lesion (ASC-H).  HPV negative  Of not she has a h/o iron  deficiency anemia and required a blood transfusion on 02/02/2024 for a hemoglobin of 8.3.  03/05/2024 CT chest abdomen pelvis with contrast Thickening of the endometrium measurement provided.  Consistent with patient's known history of endometrial carcinoma.  No adnexal masses are.  Trillat lesions.  No significant free fluid in the pelvis.  No lymphadenopathy.  No presence of metastatic disease. Small calcified lung nodules  consistent with old granulomatous disease  03/14/2024-TLH BSO with mapping and biopsies with Dr. Mancil at [facility]  Surgical Pathology:  A.  Sentinel lymph node, Left, external iliac, biopsy: - One lymph node, negative for malignancy (0/1).     B.  Sentinel lymph node, right, external iliac, biopsy: - Two lymph nodes, negative for malignancy (0/2).   C.  Uterus and bilateral ovaries and fallopian tubes, hysterectomy and bilateral salpingo-oophorectomy: - Endometrioid adenocarcinoma of the endometrium, FIGO grade 1 of 3, corded and hyalinized (CHEC) type.  - Myometrial invasion: absent.  - Lymphatic/vascular invasion: absent.  - Cervical stromal invasion: absent. - Ovaries and fallopian tubes, bilateral: Negative for malignancy.  Additional findings: Endometrial intraepithelial neoplasia (EIN)  Adenomyosis   Pelvic Washings: Negative. No evidence of malignancy  TUMOR    Tumor Size:    Greatest gross dimension (Centimeters): 4.5 cm     Histologic Type:    Endometrioid carcinoma     Histologic Grade:    FIGO grade 1     Adenomyosis:    Present, uninvolved by carcinoma     Uterine Serosal Involvement:    Not identified     Lower Uterine Segment Involvement:    Not identified     Cervical Involvement:    Not identified     Other Tissue / Organ Involvement:    Not identified (other tissues / organs submitted and not involved)     Peritoneal / Pelvic Washings / Ascitic Fluid:    Negative for malignant cells     Lymphatic and / or Vascular Invasion:    Not identified   REGIONAL LYMPH NODES     Regional Lymph Node Status:          :  All regional lymph nodes negative for tumor cells         Lymph Nodes Examined:              Total Number of Pelvic Nodes Examined:    3           Number of Pelvic Sentinel Nodes Examined:    3           Total Number of Para-aortic Nodes Examined:    0   pTNM CLASSIFICATION (AJCC 8th Edition)     Reporting of pT, pN, and (when applicable) pM categories  is based on information available to the pathologist at the time the report is issued. As per the AJCC (Chapter 1, 8th Ed.) it is the managing physician's responsibility to establish the final pathologic stage based upon all pertinent information, including but potentially not limited to this pathology report.     pT Category:    pT1a     pN Category:    pN0     N Suffix:    (sn)   FIGO STAGE     FIGO Stage (FIGO 2009 Staging / 2018 FIGO Cancer Report):    IA      % of Cells Staining Intensity Score Interpretation  ER IHC 42 2+ POSITIVE  PR IHC 51 3+ POSITIVE   P53 normal/wild-type   MMR IHC intact/MSS  Case discussed at Tumor Board, however, awaiting review of pathology.   She has a CHEC subtype, without myometrial invasion, LVSI, or cervical stromal invasion. Recommendations: Observation. Recommend germline testing given age <50 years. Offer endometrial cancer consortium study.  Preoperatively she with low iron  saturation and received 3 doses of IV Venofer .  Postoperatively her hemoglobin was 9.1  We previously discussed her slides at [facility] tumor board and that results of C AJCC associated with low-grade behavior.  Adjuvant therapy was not recommended.    Problem List: Patient Active Problem List   Diagnosis Date Noted   Endometrial cancer (HCC) 04/30/2024   Genetic testing 04/21/2024   Iron  deficiency anemia 03/07/2024   Abnormal uterine bleeding (AUB) 02/28/2024   Thickened endometrium 02/28/2024   Cervical mass 02/28/2024   Menorrhagia 11/09/2023   Ganglion cyst 10/07/2021   HLD (hyperlipidemia) 12/17/2020   COVID-19 10/15/2019   Vertigo 01/24/2019   BMI 45.0-49.9, adult (HCC) 10/18/2018   Diabetes mellitus without complication (HCC) 10/09/2018   Nodule of skin of right hand 09/13/2018   Folliculitis 09/13/2018   Family history of bleeding disorder 12/05/2017   Hypothyroidism 12/05/2017   Other fatigue 12/05/2017   HTN (hypertension) 08/30/2016   Routine physical  examination 08/30/2016    Past Medical History: Past Medical History:  Diagnosis Date   Asthma    AS A CHILD- NO INHALERS   Dizziness    happened when first diagnosed with hypertension   Elevated blood pressure reading 08/30/2016   Frequent headaches    GERD (gastroesophageal reflux disease)    OCC-TUMS PRN   Hypertension    RUQ pain 08/30/2016    Past Surgical History: Past Surgical History:  Procedure Laterality Date   CHOLECYSTECTOMY N/A 02/01/2017   Procedure: LAPAROSCOPIC CHOLECYSTECTOMY;  Surgeon: Shelva Dunnings, MD;  Location: [facility] ORS;  Service: General;  Laterality: N/A;    Past Gynecologic History:  Menarche: age 43 Menstrual details: lasts 5-6 days Menses: regular History of OCP use- yes around age 75. Discontinued Denies history of abnormal paps or STIs  OB History:  OB History  Gravida Para Term Preterm AB  Living  0 0 0 0 0 0  SAB IAB Ectopic Multiple Live Births  0 0 0 0 0   Family History: Family History  Problem Relation Age of Onset   Arthritis Mother    Hyperlipidemia Mother    Hypertension Mother    Breast cancer Mother 62       negative BRCA testing   Arthritis Father    Hyperlipidemia Father    Heart disease Father    Hypertension Father    GER disease Father    Hyperlipidemia Maternal Aunt    Hypertension Maternal Aunt    Diabetes Maternal Aunt    Breast cancer Maternal Aunt 40   Deep vein thrombosis Maternal Aunt    Cancer Maternal Uncle    Mental illness Paternal Aunt    Alcohol abuse Paternal Uncle    Cancer Paternal Uncle    Arthritis Maternal Grandmother    Hyperlipidemia Maternal Grandmother    Heart disease Maternal Grandmother    Hypertension Maternal Grandmother    Diabetes Maternal Grandmother    Breast cancer Maternal Grandmother 73   Arthritis Maternal Grandfather    Hyperlipidemia Maternal Grandfather    Alcohol abuse Paternal Grandmother    Arthritis Paternal Grandmother    Hyperlipidemia Paternal Grandmother     Hypertension Paternal Grandmother    Mental illness Paternal Grandmother    Alcohol abuse Paternal Grandfather    Arthritis Paternal Grandfather    Hyperlipidemia Paternal Grandfather    Colon cancer Neg Hx    Thyroid cancer Neg Hx    Social History: Social History   Socioeconomic History   Marital status: Single    Spouse name: Not on file   Number of children: Not on file   Years of education: Not on file   Highest education level: Bachelor's degree (e.g., BA, AB, BS)  Occupational History   Not on file  Tobacco Use   Smoking status: Never   Smokeless tobacco: Never  Vaping Use   Vaping status: Never Used  Substance and Sexual Activity   Alcohol use: Yes    Comment: RARE   Drug use: No   Sexual activity: Not Currently  Other Topics Concern   Not on file  Social History Narrative   Works at [employer]   Enjoys working in her garden   Social Drivers of Corporate Investment Banker Strain: Low Risk  (04/07/2024)   Overall Financial Resource Strain (CARDIA)    Difficulty of Paying Living Expenses: Not hard at all  Food Insecurity: No Food Insecurity (04/07/2024)   Hunger Vital Sign    Worried About Running Out of Food in the Last Year: Never true    Ran Out of Food in the Last Year: Never true  Transportation Needs: No Transportation Needs (04/07/2024)   PRAPARE - Administrator, Civil Service (Medical): No    Lack of Transportation (Non-Medical): No  Physical Activity: Unknown (04/07/2024)   Exercise Vital Sign    Days of Exercise per Week: Patient declined    Minutes of Exercise per Session: Not on file  Stress: Stress Concern Present (04/07/2024)   [employer] of Occupational Health - Occupational Stress Questionnaire    Feeling of Stress: Rather much  Social Connections: Socially Isolated (04/07/2024)   Social Connection and Isolation Panel    Frequency of Communication with Friends and Family: More than three times a week    Frequency of Social  Gatherings with Friends and Family: More than three times a  week    Attends Religious Services: Patient declined    Active Member of Clubs or Organizations: No    Attends Banker Meetings: Not on file    Marital Status: Never married  Intimate Partner Violence: Not At Risk (03/20/2024)   Humiliation, Afraid, Rape, and Kick questionnaire    Fear of Current or Ex-Partner: No    Emotionally Abused: No    Physically Abused: No    Sexually Abused: No   Allergies: No Known Allergies  Current Medications: Current Outpatient Medications  Medication Sig Dispense Refill   amLODipine  (NORVASC ) 5 MG tablet TAKE 1 TABLET BY MOUTH DAILY 90 tablet 3   levothyroxine  (SYNTHROID ) 100 MCG tablet Take 100 mcg by mouth daily.     lisinopril  (ZESTRIL ) 20 MG tablet TAKE 1 TABLET BY MOUTH DAILY 90 tablet 3   metFORMIN  (GLUCOPHAGE -XR) 500 MG 24 hr tablet TAKE 2 TABLETS BY MOUTH TWICE  DAILY 360 tablet 1   Multiple Vitamins-Minerals (EQ MULTIVITAMINS ADULT GUMMY PO) Take 2 tablets by mouth daily.     rosuvastatin  (CRESTOR ) 10 MG tablet TAKE 1 TABLET BY MOUTH DAILY 90 tablet 3   Semaglutide  (RYBELSUS ) 7 MG TABS Take 1 tablet (7 mg total) by mouth daily. 90 tablet 3   No current facility-administered medications for this visit.   Review of Systems General:  no complaints Skin: no complaints Eyes: no complaints HEENT: no complaints Breasts: no complaints Pulmonary: no complaints Cardiac: no complaints Gastrointestinal: abdominal pain Genitourinary/Sexual: no complaints Ob/Gyn: no complaints Musculoskeletal: no complaints Hematology: no complaints Neurologic/Psych: no complaints  Objective:  Physical Examination:  BP 133/74 (BP Location: Left Arm)   Pulse 96   Temp 97.6 F (36.4 C) (Tympanic)   Ht 5' 8 (1.727 m)   Wt 296 lb 3.2 oz (134.4 kg)   LMP 01/20/2024 (Exact Date)   SpO2 96%   BMI 45.04 kg/m    ECOG Performance Status: 1 - Symptomatic but completely  ambulatory  GENERAL: Patient is a well appearing female in no acute distress. Obese.  LUNGS: No audible wheezing or cough. SKIN: Incisions all well-healed EXTREMITIES:  No peripheral edema.   NEURO:  Nonfocal. Well oriented.  Appropriate affect.  Pelvic: Exam Chaperoned by CMA EGBUS: no lesions Vagina: Long speculum used.  Vaginal cuff is now well-healed. Cervix, uterus-surgically absent BME-vaginal cuff that is irregular but soft and nontender Rectovaginal: Not indicated  Lab Review    Radiologic Imaging:  Assessment:  Laurie Cameron is a 38 y.o. 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 CHEC subtype, without myometrial invasion, LVSI, or cervical stromal invasion.   Laurie Cameron is a 38 y.o. G0P0 with PMH of T2DM, BMI 43.4, HTN, hypothyroidism who presented for AUB/HMB and was found to have homogeneously thickened endometrium without uterine masses on outside pelvic US  (11/20/23). Underwent cervical polypectomy and EMB on 02/27/24 ([facility]) with final outside pathology demonstrating malignant mixed mesodermal tumor involving both EMB and polyp specimen; patchy P16, +ER/PR, p53 WT. CT CAP (03/05/24) without evidence of metastatic disease. Underwent TLH, BSO, SLND, pelvic washings on 03/18/24. 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 Pelvic sentinel nodes, bilateral fallopian tubes/ovaries and washings negative for malignancy.    Molecular/genetic testing: P53 WT, ER 2+ (42%), PR 3+ (51%), MMR IHC intact/MSS   Anemia, secondary to vaginal bleeding and iron  deficiency status post transfusion s/p iron  infusion.   Hyponatremia, hypokalemia, asymptomatic  Elevated AST  Medical co-morbidities complicating care: thyroid disease, HTN, diabetes, obesity, and prior abdominal surgery.  Plan:   Problem List Items  Addressed This Visit       Genitourinary   Endometrial cancer (HCC) - Primary   We have previously discussed the CHEC subtype.  Her case was discussed at the [facility] tumor board and felt that this subtype was associated with a low-grade behavior.  Adjuvant therapy was not recommended.  Vaginal cuff has now healed and she will return to clinic in 4 months for continued surveillance.  We discussed symptoms that would be concerning for recurrent disease and warrant sooner return..   Her germline panel testing was negative.  The patient's diagnosis, an outline of the further diagnostic and laboratory studies which will be required, the recommendation, and alternatives were discussed.  All questions were answered to the patient's satisfaction.  Prentice Agent, MD   CC:   Estil Mangle, DO

## 2024-08-14 ENCOUNTER — Other Ambulatory Visit

## 2024-09-04 ENCOUNTER — Ambulatory Visit: Admitting: Family

## 2024-09-27 ENCOUNTER — Encounter: Payer: Self-pay | Admitting: Oncology

## 2024-10-09 ENCOUNTER — Encounter: Payer: Self-pay | Admitting: Oncology

## 2024-10-16 ENCOUNTER — Encounter: Payer: Self-pay | Admitting: Family

## 2024-10-16 ENCOUNTER — Ambulatory Visit: Admitting: Family

## 2024-10-16 VITALS — BP 124/82 | HR 96 | Temp 98.6°F | Ht 68.0 in | Wt 300.0 lb

## 2024-10-16 DIAGNOSIS — R5383 Other fatigue: Secondary | ICD-10-CM | POA: Diagnosis not present

## 2024-10-16 DIAGNOSIS — E039 Hypothyroidism, unspecified: Secondary | ICD-10-CM | POA: Diagnosis not present

## 2024-10-16 DIAGNOSIS — C541 Malignant neoplasm of endometrium: Secondary | ICD-10-CM

## 2024-10-16 DIAGNOSIS — I10 Essential (primary) hypertension: Secondary | ICD-10-CM | POA: Diagnosis not present

## 2024-10-16 DIAGNOSIS — N920 Excessive and frequent menstruation with regular cycle: Secondary | ICD-10-CM | POA: Diagnosis not present

## 2024-10-16 DIAGNOSIS — Z23 Encounter for immunization: Secondary | ICD-10-CM

## 2024-10-16 DIAGNOSIS — Z7984 Long term (current) use of oral hypoglycemic drugs: Secondary | ICD-10-CM

## 2024-10-16 DIAGNOSIS — E119 Type 2 diabetes mellitus without complications: Secondary | ICD-10-CM

## 2024-10-16 LAB — POCT GLYCOSYLATED HEMOGLOBIN (HGB A1C): HbA1c POC (<> result, manual entry): 7.8 %

## 2024-10-16 MED ORDER — LANCETS MISC: 1.0000 | 0 refills | Status: AC

## 2024-10-16 MED ORDER — LANCET DEVICE MISC: 1.0000 | 0 refills | Status: AC

## 2024-10-16 MED ORDER — BLOOD GLUCOSE MONITORING SUPPL DEVI: 1.0000 | 0 refills | Status: AC

## 2024-10-16 MED ORDER — BLOOD GLUCOSE TEST VI STRP: 1.0000 | ORAL_STRIP | 0 refills | Status: AC

## 2024-10-16 NOTE — Assessment & Plan Note (Signed)
 Blood pressure well-controlled.  Continue lisinopril  20 mg every day, amlodipine  5 mg every day.

## 2024-10-16 NOTE — Progress Notes (Signed)
 "  Assessment & Plan:  Diabetes mellitus without complication (HCC) Assessment & Plan: Slightly increased from prior.  Discussed eating around the holidays.  Patient is actively working on her diet and politely declines escalation of diabetic regimen.  Continue metformin  2000 mg daily, Rybelsus  7 mg daily.  Would consider adding on Jardiance as previously she had nausea on Rybelsus , since resolved.  Provided glucometer.  Counseled on spot checking fasting glucose in between appointments  Orders: -     POCT glycosylated hemoglobin (Hb A1C) -     Microalbumin / creatinine urine ratio -     Blood Glucose Monitoring Suppl; 1 each by Does not apply route as directed. Dispense based on patient and insurance preference. Use up to four times daily as directed. (FOR ICD-10 E10.9, E11.9).  Dispense: 1 each; Refill: 0 -     Blood Glucose Test; 1 each by Does not apply route as directed. Dispense based on patient and insurance preference. Use up to four times daily as directed. (FOR ICD-10 E10.9, E11.9).  Dispense: 100 strip; Refill: 0 -     Lancet Device; 1 each by Does not apply route as directed. Dispense based on patient and insurance preference. Use up to four times daily as directed. (FOR ICD-10 E10.9, E11.9).  Dispense: 1 each; Refill: 0 -     Lancets; 1 each by Does not apply route as directed. Dispense based on patient and insurance preference. Use up to four times daily as directed. (FOR ICD-10 E10.9, E11.9).  Dispense: 100 each; Refill: 0  Need for influenza vaccination -     Flu vaccine trivalent PF, 6mos and older(Flulaval,Afluria,Fluarix,Fluzone)  Endometrial cancer (HCC)  Hypertension, unspecified type Assessment & Plan: Blood pressure well-controlled.  Continue lisinopril  20 mg every day, amlodipine  5 mg every day.   Orders: -     Comprehensive metabolic panel with GFR  Menorrhagia with regular cycle -     Comprehensive metabolic panel with GFR  Hypothyroidism, unspecified type -      Lipid panel  Other fatigue -     Thyroid Panel With TSH -     VITAMIN D  25 Hydroxy (Vit-D Deficiency, Fractures)     Return precautions given.   Risks, benefits, and alternatives of the medications and treatment plan prescribed today were discussed, and patient expressed understanding.   Education regarding symptom management and diagnosis given to patient on AVS either electronically or printed.  No follow-ups on file.  Rollene Northern, FNP  Subjective:    Patient ID: Laurie Cameron, female    DOB: [birth year], 39 y.o.   MRN: [id number]  CC: Laurie Cameron is a 39 y.o. female who presents today for follow up.   HPI: Overall feels well today.  No new complaints. Endorses dietary discretion around the holidays.  She remains compliant with metformin  2000 mg daily, Rybelsus  7 mg daily.  Tolerating Rybelsus  without nausea.   Follow-up with endocrinology Dr Damian for hypothyroidism.  Last seen 06/16/2024   Endometrial cancer, s/p TAH BSO in 02/2024  Allergies: Patient has no known allergies. Medications Ordered Prior to Encounter[1]  Review of Systems  Constitutional:  Negative for chills and fever.  Respiratory:  Negative for cough.   Cardiovascular:  Negative for chest pain and palpitations.  Gastrointestinal:  Negative for nausea and vomiting.      Objective:    BP 124/82   Pulse 96   Temp 98.6 F (37 C) (Oral)   Ht 5' 8 (1.727 m)   Wt 300  lb (136.1 kg)   LMP 01/20/2024   SpO2 97%   BMI 45.61 kg/m  BP Readings from Last 3 Encounters:  10/16/24 124/82  07/30/24 133/74  05/14/24 (!) 147/91   Wt Readings from Last 3 Encounters:  10/16/24 300 lb (136.1 kg)  07/30/24 296 lb 3.2 oz (134.4 kg)  05/14/24 (!) 301 lb (136.5 kg)    Physical Exam Vitals reviewed.  Constitutional:      Appearance: She is well-developed.  Eyes:     Conjunctiva/sclera: Conjunctivae normal.  Cardiovascular:     Rate and Rhythm: Normal rate and regular rhythm.     Pulses: Normal  pulses.     Heart sounds: Normal heart sounds.  Pulmonary:     Effort: Pulmonary effort is normal.     Breath sounds: Normal breath sounds. No wheezing, rhonchi or rales.  Skin:    General: Skin is warm and dry.  Neurological:     Mental Status: She is alert.  Psychiatric:        Speech: Speech normal.        Behavior: Behavior normal.        Thought Content: Thought content normal.            [1]  Current Outpatient Medications on File Prior to Visit  Medication Sig Dispense Refill   amLODipine  (NORVASC ) 5 MG tablet TAKE 1 TABLET BY MOUTH DAILY 90 tablet 3   levothyroxine  (SYNTHROID ) 100 MCG tablet Take 100 mcg by mouth daily.     lisinopril  (ZESTRIL ) 20 MG tablet TAKE 1 TABLET BY MOUTH DAILY 90 tablet 3   metFORMIN  (GLUCOPHAGE -XR) 500 MG 24 hr tablet TAKE 2 TABLETS BY MOUTH TWICE  DAILY 360 tablet 1   Multiple Vitamins-Minerals (EQ MULTIVITAMINS ADULT GUMMY PO) Take 2 tablets by mouth daily.     rosuvastatin  (CRESTOR ) 10 MG tablet TAKE 1 TABLET BY MOUTH DAILY 90 tablet 3   Semaglutide  (RYBELSUS ) 7 MG TABS Take 1 tablet (7 mg total) by mouth daily. 90 tablet 3   No current facility-administered medications on file prior to visit.   "

## 2024-10-16 NOTE — Patient Instructions (Addendum)
 3 month follow up. Please make appointment at the front desk. It was great seeing you!  Goal of fasting blood sugar is between 70-120. If in this range, we are reaching our target a1c ( goal 6.5%)   Please check fasting blood sugar in the morning time once or twice a week.  You may also check if you feel like you are having a low episode or particularly high episode of blood sugar.  If blood sugars increase, I may advise you to check blood sugar after your largest meal.  You specifically do this TWO hours after largest meal with the goal of being less than 180.  If blood sugar is checked sooner than 2 hours after largest meal, and it will be  expected to be elevated. You must wait 2 hours.   If your blood sugar is less than 180 hours after your largest meal, again we are reaching our target a1c goal   Call [facility] clinic if: BG < 70 or > 300.   If you have any symptoms of low blood sugar ( sweating, shakiness, lightheaded, dizzy) that you notify me. If you have a low, please drink a glass of orange juice and recheck blood sugar every 5 minutes until you dont feel symptomatic AND blood sugar is above 80.

## 2024-10-16 NOTE — Assessment & Plan Note (Addendum)
 Slightly increased from prior.  Discussed eating around the holidays.  Patient is actively working on her diet and politely declines escalation of diabetic regimen.  Continue metformin  2000 mg daily, Rybelsus  7 mg daily.  Would consider adding on Jardiance as previously she had nausea on Rybelsus , since resolved.  Provided glucometer.  Counseled on spot checking fasting glucose in between appointments

## 2024-11-14 ENCOUNTER — Other Ambulatory Visit

## 2024-11-14 ENCOUNTER — Ambulatory Visit: Admitting: Oncology

## 2024-12-03 ENCOUNTER — Inpatient Hospital Stay

## 2025-01-15 ENCOUNTER — Ambulatory Visit: Admitting: Family
